# Patient Record
Sex: Male | Born: 1955 | Race: Black or African American | Hispanic: No | Smoking: Former smoker
Health system: Southern US, Community
[De-identification: ages and names within clinical notes are randomized; demographics above are authoritative.]

## PROBLEM LIST (undated history)

## (undated) DIAGNOSIS — G5620 Lesion of ulnar nerve, unspecified upper limb: Secondary | ICD-10-CM

## (undated) DIAGNOSIS — R0789 Other chest pain: Secondary | ICD-10-CM

## (undated) DIAGNOSIS — G43909 Migraine, unspecified, not intractable, without status migrainosus: Secondary | ICD-10-CM

## (undated) DIAGNOSIS — I219 Acute myocardial infarction, unspecified: Secondary | ICD-10-CM

## (undated) DIAGNOSIS — K759 Inflammatory liver disease, unspecified: Secondary | ICD-10-CM

## (undated) DIAGNOSIS — I2510 Atherosclerotic heart disease of native coronary artery without angina pectoris: Secondary | ICD-10-CM

## (undated) DIAGNOSIS — I10 Essential (primary) hypertension: Secondary | ICD-10-CM

## (undated) DIAGNOSIS — F329 Major depressive disorder, single episode, unspecified: Secondary | ICD-10-CM

## (undated) DIAGNOSIS — F419 Anxiety disorder, unspecified: Secondary | ICD-10-CM

## (undated) DIAGNOSIS — E785 Hyperlipidemia, unspecified: Secondary | ICD-10-CM

## (undated) DIAGNOSIS — I639 Cerebral infarction, unspecified: Secondary | ICD-10-CM

## (undated) DIAGNOSIS — M1991 Primary osteoarthritis, unspecified site: Secondary | ICD-10-CM

## (undated) DIAGNOSIS — R011 Cardiac murmur, unspecified: Secondary | ICD-10-CM

## (undated) DIAGNOSIS — G40309 Generalized idiopathic epilepsy and epileptic syndromes, not intractable, without status epilepticus: Secondary | ICD-10-CM

## (undated) DIAGNOSIS — M4802 Spinal stenosis, cervical region: Secondary | ICD-10-CM

## (undated) DIAGNOSIS — R569 Unspecified convulsions: Secondary | ICD-10-CM

## (undated) DIAGNOSIS — D649 Anemia, unspecified: Secondary | ICD-10-CM

## (undated) HISTORY — PX: HERNIA REPAIR: SHX51

## (undated) HISTORY — DX: Primary osteoarthritis, unspecified site: M19.91

## (undated) HISTORY — DX: Cerebral infarction, unspecified: I63.9

## (undated) HISTORY — PX: COLON SURGERY: SHX602

## (undated) HISTORY — DX: Other chest pain: R07.89

## (undated) HISTORY — DX: Migraine, unspecified, not intractable, without status migrainosus: G43.909

## (undated) HISTORY — DX: Anemia, unspecified: D64.9

## (undated) HISTORY — DX: Hyperlipidemia, unspecified: E78.5

## (undated) HISTORY — PX: ABDOMINAL EXPLORATION SURGERY: SHX538

## (undated) HISTORY — DX: Generalized idiopathic epilepsy and epileptic syndromes, not intractable, without status epilepticus: G40.309

## (undated) HISTORY — PX: BACK SURGERY: SHX140

## (undated) HISTORY — DX: Atherosclerotic heart disease of native coronary artery without angina pectoris: I25.10

## (undated) HISTORY — DX: Lesion of ulnar nerve, unspecified upper limb: G56.20

## (undated) HISTORY — DX: Major depressive disorder, single episode, unspecified: F32.9

## (undated) HISTORY — DX: Essential (primary) hypertension: I10

## (undated) HISTORY — DX: Spinal stenosis, cervical region: M48.02

## (undated) HISTORY — PX: SOFT TISSUE TUMOR RESECTION: SHX1054

## (undated) NOTE — *Deleted (*Deleted)
10/19/2020 11:21 AM   Abagail Kitchens Jun 04, 1956 [id number]  Referring provider: Preston Fleeting, MD [address] [place],  [place] [address]  No chief complaint on file.   HPI: 74 year old male with a personal history of bladder neck obstruction, prostate nodule and BPH with LUTS returns today for routine annual follow-up.  History of bladder neck obstruction He is s/p TULIP 10/2016 for bladder neck obstruction.  Cysto 11/2019 with Dr. Apolinar Junes revealed a widely patent bladder neck, no evidence of bladder neck contracture or recurrence of his stricture.  PVR today is *** mL.    Prostate nodule Noted to have a nodule the left base of the prostate in 2019.  He underwent prostate biopsy 08/2018 due to the presence of the nodule and a strong family history of advanced metastatic prostate cancer.  Prostate biopsy was benign.  TRUS vol 42 g.  PSA 1.4 on 09/14/2019.   Component     Latest Ref Rng & Units 04/18/2016 10/18/2016 07/12/2017 07/09/2018  Prostate Specific Ag, Serum     0.0 - 4.0 ng/mL 1.2 0.7 1.2 1.6   Component     Latest Ref Rng & Units 09/14/2019 10/13/2020  Prostate Specific Ag, Serum     0.0 - 4.0 ng/mL 1.4 1.7    BPH WITH LUTS  (prostate and/or bladder) I PSS score: ***      PVR: ***     Previous IPSS score: 11/2   Previous PVR: 40 mL     Major complaint(s):  Nocturia and hesitancy x for over three years. Denies any dysuria, hematuria or suprapubic pain.   Currently taking: oxybutynin XL 15 mg daily   Denies any recent fevers, chills, nausea or vomiting.     Score:  1-7 Mild 8-19 Moderate 20-35 Severe  PMH: Past Medical History:  Diagnosis Date  . 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 Anxiety   . Atypical chest pain 04/17/2016  . CAD in native artery 04/17/2016  . Cerebral infarction (HCC) 11/25/2013   Overview:  IMO Problem List Replacer Jan. 2016   . Cervical spinal stenosis 07/06/2015  . Essential (primary) hypertension 04/15/2008  . 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 Headache, migraine 11/25/2013  . Heart murmur   . Hepatitis    Hep "C"  . HLD (hyperlipidemia) 03/27/2012  . Hyperlipidemia   . Idiopathic localized osteoarthropathy 02/26/2011  . Major depressive disorder with single episode 11/14/2009  . Myocardial infarction (HCC) 2009  . 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 Seizures (HCC)    Last Seizure 2013  . Stroke [facility])     Surgical History: Past Surgical History:  Procedure Laterality Date  . ABDOMINAL EXPLORATION SURGERY  2010?  [facility] Kitchen BACK SURGERY    . CERVICAL DISC ARTHROPLASTY N/A 09/11/2017   Procedure: CERVICAL ANTERIOR DISC ARTHROPLASTY C3-4 POSSIBLE C4-5;  Surgeon: Venetia Night, MD;  Location: [facility] ORS;  Service: Neurosurgery;  Laterality: N/A;  . COLON SURGERY     Colon Resection  . HERNIA REPAIR    . HOLMIUM LASER APPLICATION N/A 11/20/2016   Procedure: HOLMIUM LASER APPLICATION;  Surgeon: Vanna Scotland, MD;  Location: [facility] ORS;  Service: Urology;  Laterality: N/A;  . PROSTATE ABLATION N/A 11/20/2016   Procedure: PROSTATE ABLATION;  Surgeon: Vanna Scotland, MD;  Location: [facility] ORS;  Service: Urology;  Laterality: N/A;  . SOFT TISSUE TUMOR RESECTION     left thigh  . TRANSURETHRAL RESECTION OF PROSTATE N/A 11/20/2016   Procedure: TRANSURETHRAL RESECTION OF THE PROSTATE (TURP);  Surgeon: Vanna Scotland, MD;  Location: [facility] ORS;  Service: Urology;  Laterality: N/A;    Home Medications:  Allergies as of 10/19/2020      Reactions   Gabapentin Other (See Comments)   Lisinopril    Sulfa Antibiotics Rash      Medication List       Accurate as of October 18, 2020 11:21 AM. If you have any questions, ask your nurse or doctor.        aspirin EC 81 MG tablet Take 1 tablet (81 mg total) by mouth daily.   cholecalciferol 1000 units tablet Commonly known as: VITAMIN D Take 1,000 Units by mouth daily.   Vitamin D-3 125 MCG (5000 UT) Tabs Take 5,000 Units by mouth daily.    citalopram 40 MG tablet Commonly known as: CELEXA TAKE 1 TABLET DAILY BY MOUTH   hydrochlorothiazide 25 MG tablet Commonly known as: HYDRODIURIL Take 25 mg by mouth daily.   oxybutynin 15 MG 24 hr tablet Commonly known as: DITROPAN XL Take 1 tablet (15 mg total) by mouth daily.   phenytoin 100 MG ER capsule Commonly known as: DILANTIN TAKE 3 CAPSULES IN THE MORNING AND 2 CAPSULES EVERY EVENING   pregabalin 75 MG capsule Commonly known as: LYRICA Take by mouth.   simvastatin 20 MG tablet Commonly known as: ZOCOR TAKE 1 TABLET EVERY EVENING FOR HIGH CHOLESTEROL       Allergies:  Allergies  Allergen Reactions  . Gabapentin Other (See Comments)  . Lisinopril   . Sulfa Antibiotics Rash    Family History: Family History  Problem Relation Age of Onset  . Bladder Cancer Neg Hx   . Prostate cancer Neg Hx   . Kidney cancer Neg Hx     Social History:  reports that he quit smoking about 13 years ago. His smoking use included cigarettes. He has never used smokeless tobacco. He reports current drug use. Drug: Marijuana. He reports that he does not drink alcohol.  ROS: For pertinent review of systems please refer to history of present illness  Physical Exam: There were no vitals taken for this visit.  Constitutional:  Well nourished. Alert and oriented, No acute distress. HEENT: [place] AT, moist mucus membranes.  Trachea midline Cardiovascular: No clubbing, cyanosis, or edema. Respiratory: Normal respiratory effort, no increased work of breathing. GI: Abdomen is soft, non tender, non distended, no abdominal masses. Liver and spleen not palpable.  No hernias appreciated.  Stool sample for occult testing is not indicated.   GU: No CVA tenderness.  No bladder fullness or masses.  Patient with circumcised/uncircumcised phallus. ***Foreskin easily retracted***  Urethral meatus is patent.  No penile discharge. No penile lesions or rashes. Scrotum without lesions, cysts, rashes and/or  edema.  Testicles are located scrotally bilaterally. No masses are appreciated in the testicles. Left and right epididymis are normal. Rectal: Patient with  normal sphincter tone. Anus and perineum without scarring or rashes. No rectal masses are appreciated. Prostate is approximately *** grams, *** nodules are appreciated. Seminal vesicles are normal. Skin: No rashes, bruises or suspicious lesions. Lymph: No inguinal adenopathy. Neurologic: Grossly intact, no focal deficits, moving all 4 extremities. Psychiatric: Normal mood and affect.  Laboratory Data: Urinalysis *** Lab Results  Component Value Date   WBC 5.7 08/30/2017   HGB 13.4 08/30/2017   HCT 39.7 (L) 08/30/2017   MCV 90.8 08/30/2017   PLT 235 08/30/2017    Lab Results  Component Value Date   CREATININE 0.88 08/30/2017  PSA as above I have reviewed the labs.  Pertinent imaging ***   Assessment & Plan:    1.  BPH with LUTS IPSS score is 11/2 Continue conservative management, avoiding bladder irritants and timed voiding's At goal with oxybutynin Continue oxybutynin XL 15 mg daily -changed script to 90 days with refills RTC in September 2021 for PSA, I PSS, PVR and exam  2. Family history of prostate cancer Status post previous biopsy which was negative RTC in September 2021 for PSA and exam  3. Prostate nodule As above, likely BPH nodule   No follow-ups on file.  Michiel Cowboy, PA-C  [facility] [address] [place], [place] [address] [phone]

---

## 2004-10-12 ENCOUNTER — Emergency Department: Payer: Self-pay | Admitting: Emergency Medicine

## 2005-10-23 ENCOUNTER — Emergency Department: Payer: Self-pay | Admitting: Unknown Physician Specialty

## 2005-10-23 ENCOUNTER — Other Ambulatory Visit: Payer: Self-pay

## 2006-01-25 ENCOUNTER — Emergency Department: Payer: Self-pay | Admitting: Emergency Medicine

## 2006-04-15 ENCOUNTER — Ambulatory Visit: Payer: Self-pay | Admitting: General Practice

## 2008-01-01 DIAGNOSIS — I219 Acute myocardial infarction, unspecified: Secondary | ICD-10-CM

## 2008-01-01 HISTORY — DX: Acute myocardial infarction, unspecified: I21.9

## 2008-02-20 DIAGNOSIS — G40309 Generalized idiopathic epilepsy and epileptic syndromes, not intractable, without status epilepticus: Secondary | ICD-10-CM

## 2008-02-20 HISTORY — DX: Generalized idiopathic epilepsy and epileptic syndromes, not intractable, without status epilepticus: G40.309

## 2008-04-15 DIAGNOSIS — I10 Essential (primary) hypertension: Secondary | ICD-10-CM

## 2008-04-15 HISTORY — DX: Essential (primary) hypertension: I10

## 2008-07-09 ENCOUNTER — Other Ambulatory Visit: Payer: Self-pay

## 2008-07-09 ENCOUNTER — Observation Stay: Payer: Self-pay | Admitting: Internal Medicine

## 2008-08-13 ENCOUNTER — Other Ambulatory Visit: Payer: Self-pay

## 2008-08-13 ENCOUNTER — Inpatient Hospital Stay: Payer: Self-pay | Admitting: Internal Medicine

## 2009-11-14 DIAGNOSIS — F329 Major depressive disorder, single episode, unspecified: Secondary | ICD-10-CM

## 2009-11-14 HISTORY — DX: Major depressive disorder, single episode, unspecified: F32.9

## 2011-02-26 DIAGNOSIS — M1991 Primary osteoarthritis, unspecified site: Secondary | ICD-10-CM | POA: Insufficient documentation

## 2011-02-26 HISTORY — DX: Primary osteoarthritis, unspecified site: M19.91

## 2012-03-27 DIAGNOSIS — D649 Anemia, unspecified: Secondary | ICD-10-CM | POA: Insufficient documentation

## 2012-03-27 DIAGNOSIS — E785 Hyperlipidemia, unspecified: Secondary | ICD-10-CM

## 2012-03-27 HISTORY — DX: Anemia, unspecified: D64.9

## 2012-03-27 HISTORY — DX: Hyperlipidemia, unspecified: E78.5

## 2013-11-25 DIAGNOSIS — G43909 Migraine, unspecified, not intractable, without status migrainosus: Secondary | ICD-10-CM | POA: Insufficient documentation

## 2013-11-25 DIAGNOSIS — I639 Cerebral infarction, unspecified: Secondary | ICD-10-CM

## 2013-11-25 HISTORY — DX: Migraine, unspecified, not intractable, without status migrainosus: G43.909

## 2013-11-25 HISTORY — DX: Cerebral infarction, unspecified: I63.9

## 2014-04-08 ENCOUNTER — Emergency Department: Payer: Self-pay | Admitting: Emergency Medicine

## 2014-09-24 ENCOUNTER — Emergency Department: Payer: Self-pay | Admitting: Emergency Medicine

## 2014-09-24 LAB — COMPREHENSIVE METABOLIC PANEL
Albumin: 3.6 g/dL (ref 3.4–5.0)
Alkaline Phosphatase: 105 U/L
Anion Gap: 4 — ABNORMAL LOW (ref 7–16)
BILIRUBIN TOTAL: 0.3 mg/dL (ref 0.2–1.0)
BUN: 13 mg/dL (ref 7–18)
CREATININE: 1.41 mg/dL — AB (ref 0.60–1.30)
Calcium, Total: 8.7 mg/dL (ref 8.5–10.1)
Chloride: 104 mmol/L (ref 98–107)
Co2: 29 mmol/L (ref 21–32)
EGFR (African American): >60
EGFR (Non-African Amer.): 55 — ABNORMAL LOW
GLUCOSE: 111 mg/dL — AB (ref 65–99)
Osmolality: 275 (ref 275–301)
POTASSIUM: 3.5 mmol/L (ref 3.5–5.1)
SGOT(AST): 77 U/L — ABNORMAL HIGH (ref 15–37)
SGPT (ALT): 58 U/L
Sodium: 137 mmol/L (ref 136–145)
TOTAL PROTEIN: 7.9 g/dL (ref 6.4–8.2)

## 2014-09-24 LAB — URINALYSIS, COMPLETE
BILIRUBIN, UR: NEGATIVE
Bacteria: NONE SEEN
Glucose,UR: NEGATIVE mg/dL (ref 0–75)
Hyaline Cast: 32
Ketone: NEGATIVE
Leukocyte Esterase: NEGATIVE
NITRITE: NEGATIVE
PH: 5 (ref 4.5–8.0)
Protein: 100
SPECIFIC GRAVITY: 1.02 (ref 1.003–1.030)
Squamous Epithelial: 1
WBC UR: 5 /HPF (ref 0–5)

## 2014-09-24 LAB — CBC WITH DIFFERENTIAL/PLATELET
BASOS ABS: 0 10*3/uL (ref 0.0–0.1)
Basophil %: 0.4 %
EOS ABS: 0 10*3/uL (ref 0.0–0.7)
EOS PCT: 0.1 %
HCT: 43.3 % (ref 40.0–52.0)
HGB: 14.9 g/dL (ref 13.0–18.0)
Lymphocyte #: 0.4 10*3/uL — ABNORMAL LOW (ref 1.0–3.6)
Lymphocyte %: 7.7 %
MCH: 31.2 pg (ref 26.0–34.0)
MCHC: 34.3 g/dL (ref 32.0–36.0)
MCV: 91 fL (ref 80–100)
MONO ABS: 0.3 x10 3/mm (ref 0.2–1.0)
MONOS PCT: 6.7 %
NEUTROS PCT: 85.1 %
Neutrophil #: 4.3 10*3/uL (ref 1.4–6.5)
PLATELETS: 148 10*3/uL — AB (ref 150–440)
RBC: 4.77 10*6/uL (ref 4.40–5.90)
RDW: 13.9 % (ref 11.5–14.5)
WBC: 5 10*3/uL (ref 3.8–10.6)

## 2014-09-24 LAB — LIPASE, BLOOD: LIPASE: 110 U/L (ref 73–393)

## 2014-09-24 LAB — PHENYTOIN LEVEL, TOTAL: Dilantin: 3.9 ug/mL — ABNORMAL LOW (ref 10.0–20.0)

## 2014-09-29 LAB — CULTURE, BLOOD (SINGLE)

## 2014-10-06 DIAGNOSIS — G4485 Primary stabbing headache: Secondary | ICD-10-CM | POA: Insufficient documentation

## 2015-01-19 DIAGNOSIS — G40309 Generalized idiopathic epilepsy and epileptic syndromes, not intractable, without status epilepticus: Secondary | ICD-10-CM | POA: Insufficient documentation

## 2015-07-06 DIAGNOSIS — M4802 Spinal stenosis, cervical region: Secondary | ICD-10-CM

## 2015-07-06 HISTORY — DX: Spinal stenosis, cervical region: M48.02

## 2015-09-29 DIAGNOSIS — G5620 Lesion of ulnar nerve, unspecified upper limb: Secondary | ICD-10-CM

## 2015-09-29 HISTORY — DX: Lesion of ulnar nerve, unspecified upper limb: G56.20

## 2016-04-17 ENCOUNTER — Other Ambulatory Visit: Payer: Self-pay | Admitting: Cardiology

## 2016-04-17 DIAGNOSIS — Z01810 Encounter for preprocedural cardiovascular examination: Secondary | ICD-10-CM

## 2016-04-17 DIAGNOSIS — R0789 Other chest pain: Secondary | ICD-10-CM

## 2016-04-17 DIAGNOSIS — I2510 Atherosclerotic heart disease of native coronary artery without angina pectoris: Secondary | ICD-10-CM

## 2016-04-17 HISTORY — DX: Other chest pain: R07.89

## 2016-04-17 HISTORY — DX: Atherosclerotic heart disease of native coronary artery without angina pectoris: I25.10

## 2016-04-18 ENCOUNTER — Encounter: Payer: Self-pay | Admitting: Urology

## 2016-04-18 ENCOUNTER — Ambulatory Visit (INDEPENDENT_AMBULATORY_CARE_PROVIDER_SITE_OTHER): Payer: Medicaid Other | Admitting: Urology

## 2016-04-18 VITALS — BP 125/72 | HR 60 | Ht 70.0 in | Wt 146.0 lb

## 2016-04-18 DIAGNOSIS — Z125 Encounter for screening for malignant neoplasm of prostate: Secondary | ICD-10-CM

## 2016-04-18 DIAGNOSIS — N138 Other obstructive and reflux uropathy: Secondary | ICD-10-CM

## 2016-04-18 DIAGNOSIS — N419 Inflammatory disease of prostate, unspecified: Secondary | ICD-10-CM

## 2016-04-18 DIAGNOSIS — N401 Enlarged prostate with lower urinary tract symptoms: Secondary | ICD-10-CM | POA: Diagnosis not present

## 2016-04-18 LAB — BLADDER SCAN AMB NON-IMAGING

## 2016-04-18 MED ORDER — FINASTERIDE 5 MG PO TABS: 5.0000 mg | ORAL_TABLET | Freq: Every day | ORAL | Status: DC

## 2016-04-18 NOTE — Progress Notes (Signed)
04/18/2016 8:12 PM   Kenneth Daniels 02/01/1956 [id number]  Referring provider: Rochel Brome  Chief Complaint  Patient presents with  . Prostatitis    New Patient    HPI:  60 year old male who scheduled to undergo bilateral inguinal hernia repair with Dr. Tamala Julian in the near future referred for workup of voiding symptoms.  He does report significant urinary symptoms including straining when urinating, sensation of incomplete bladder emptying, and urinary frequency along with nocturia 3. He is overall dissatisfied with his voiding symptoms.  This has been going on for at least a year.    He is currently on Flomax and has been for several years.  No recent PSA.  + cousin died of prostate cancer, age 69.    PVR today 44 cc.  PCP Dr. Reather Laurence at [facility] center.       IPSS      04/18/16 1400       International Prostate Symptom Score   How often have you had the sensation of not emptying your bladder? Almost always     How often have you had to urinate less than every two hours? Almost always     How often have you found you stopped and started again several times when you urinated? About half the time     How often have you found it difficult to postpone urination? Less than half the time     How often have you had a weak urinary stream? About half the time     How often have you had to strain to start urination? More than half the time     How many times did you typically get up at night to urinate? 3 Times     Total IPSS Score 25     Quality of Life due to urinary symptoms   If you were to spend the rest of your life with your urinary condition just the way it is now how would you feel about that? Mostly Disatisfied        Score:  1-7 Mild 8-19 Moderate 20-35 Severe   PMH: Past Medical History  Diagnosis Date  . CAD in native artery 04/17/2016  . Essential (primary) hypertension 04/15/2008  . Headache, migraine 11/25/2013  . Cerebral  infarction ([place]) 11/25/2013    Overview:  IMO Problem List Replacer Jan. 2016   . Generalized convulsive epilepsy ([place]) 02/20/2008  . 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 Idiopathic localized osteoarthropathy 02/26/2011  . 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 Atypical chest pain 04/17/2016  . Cervical spinal stenosis 07/06/2015  . HLD (hyperlipidemia) 03/27/2012  . Major depressive disorder with single episode ([place]) 11/14/2009    Surgical History: Past Surgical History  Procedure Laterality Date  . Back surgery    . Soft tissue tumor resection      left thigh  . Abdominal exploration surgery  2010?    Home Medications:    Medication List       This list is accurate as of: 04/18/16 11:59 PM.  Always use your most recent med list.               citalopram 40 MG tablet  Commonly known as:  CELEXA  TK 1 T PO QD     finasteride 5 MG tablet  Commonly known as:  PROSCAR  Take 1 tablet (5 mg total) by mouth daily.     lisinopril 10 MG tablet  Commonly known as:  PRINIVIL,ZESTRIL  TK  1 T PO D FOR HIGH BP     phenytoin 100 MG ER capsule  Commonly known as:  DILANTIN  TK 3 CS PO QAM AND 2 CS PO QPM     simvastatin 20 MG tablet  Commonly known as:  ZOCOR  TK 1 T PO QPM FOR HIGH CHOLESTEROL     tamsulosin 0.4 MG Caps capsule  Commonly known as:  FLOMAX  TK 2 CS PO QD 30 MIN AFTER THE SAME MEAL B BED        Allergies:  Allergies  Allergen Reactions  . Sulfa Antibiotics     Other reaction(s): RASH    Family History: Family History  Problem Relation Age of Onset  . Bladder Cancer Neg Hx   . Prostate cancer Neg Hx   . Kidney cancer Neg Hx     Social History:  reports that he has quit smoking. He does not have any smokeless tobacco history on file. He reports that he uses illicit drugs (Marijuana). He reports that he does not drink alcohol.  ROS: UROLOGY Frequent Urination?: Yes Hard to postpone urination?: Yes Burning/pain with urination?: No Get up at night to  urinate?: Yes Leakage of urine?: Yes Urine stream starts and stops?: Yes Trouble starting stream?: No Do you have to strain to urinate?: Yes Blood in urine?: No Urinary tract infection?: No Sexually transmitted disease?: No Injury to kidneys or bladder?: No Painful intercourse?: No Weak stream?: No Erection problems?: Yes Penile pain?: No  Gastrointestinal Nausea?: No Vomiting?: No Indigestion/heartburn?: No Diarrhea?: No Constipation?: No  Constitutional Fever: No Night sweats?: Yes Weight loss?: Yes Fatigue?: Yes  Skin Skin rash/lesions?: Yes Itching?: No  Eyes Blurred vision?: No Double vision?: No  Ears/Nose/Throat Sore throat?: Yes Sinus problems?: No  Hematologic/Lymphatic Swollen glands?: Yes Easy bruising?: No  Cardiovascular Leg swelling?: No Chest pain?: No  Respiratory Cough?: Yes Shortness of breath?: Yes  Endocrine Excessive thirst?: No  Musculoskeletal Back pain?: Yes Joint pain?: No  Neurological Headaches?: Yes Dizziness?: Yes  Psychologic Depression?: Yes Anxiety?: Yes  Physical Exam: BP 125/72 mmHg  Pulse 60  Ht 5' 10" (1.778 m)  Wt 146 lb (66.225 kg)  BMI 20.95 kg/m2  Constitutional:  Alert and oriented, No acute distress. HEENT: [place] AT, moist mucus membranes.  Trachea midline, no masses. Cardiovascular: No clubbing, cyanosis, or edema. Respiratory: Normal respiratory effort, no increased work of breathing. GI: Abdomen is soft, nontender, nondistended, no abdominal masses GU: No CVA tenderness.  Uncircumcised phallus, easily retractable foreskin.  Normal descended testicles with no masses. Rectal exam: normal sphincter tone.  40-50 cc prostate, nontender, no masses.   Skin: No rashes, bruises or suspicious lesions. Lymph: No cervical or inguinal adenopathy. Neurologic: Grossly intact, no focal deficits, moving all 4 extremities. Psychiatric: Normal mood and affect.  Laboratory Data: Comprehensive Metabolic Panel  (CMP) (68/11/7516 12:15 PM) Comprehensive Metabolic Panel (CMP) (00/17/4944 12:15 PM)  Component Value Ref Range  Glucose 75 70 - 110 mg/dL  Sodium 137 136 - 145 mmol/L  Potassium 5.1 3.6 - 5.1 mmol/L  Chloride 102 97 - 109 mmol/L  Carbon Dioxide (CO2) 31.1 22.0 - 32.0 mmol/L  Urea Nitrogen (BUN) 14 7 - 25 mg/dL  Creatinine 1.0 0.7 - 1.3 mg/dL  Glomerular Filtration Rate (eGFR), MDRD Estimate 92 >60 mL/min/1.73sq m   CBC w/auto Differential (5 Part) (04/11/2016 12:15 PM) CBC w/auto Differential (5 Part) (04/11/2016 12:15 PM)  Component Value Ref Range  WBC (White Blood Cell Count) 5.8 4.1 - 10.2  10^3/uL  RBC (Red Blood Cell Count) 4.05 (L) 4.69 - 6.13 10^6/uL  Hemoglobin 12.5 (L) 14.1 - 18.1 gm/dL  Hematocrit 37.5 (L) 40.0 - 52.0 %  MCV (Mean Corpuscular Volume) 92.6 80.0 - 100.0 fl  MCH (Mean Corpuscular Hemoglobin) 30.9 27.0 - 31.2 pg  MCHC (Mean Corpuscular Hemoglobin Concentration) 33.3 32.0 - 36.0 gm/dL  Platelet Count 238 150 - 450 10^3/uL      Urinalysis UA negative   Pertinent Imaging: n/a  Assessment & Plan:    1. BPH (benign prostatic hypertrophy) with urinary obstruction Primarily obstructing voiding symptoms, chronic No evidence of incomplete bladder emtpying, PVR minimal Continue flomax Start finasteride, discussed side effects and time to maximal efficacy May require outlet procedure down the road if fails to resolve/ improve - Urinalysis, Complete - BLADDER SCAN AMB NON-IMAGING  2. Screening PSA (prostate specific antigen) PSA checked today after discussion of prostate cancer screening DRE enlarged but non concerning - PSA -will call with PSA results   Return in about 6 months (around 10/18/2016) for IPSS.  Hollice Espy, MD  [facility] [address], [place], [place] [address] [phone]  No urological contraindications for hernia repair.  Slightly higher risk for post op retention but medically  optimized.

## 2016-04-19 LAB — URINALYSIS, COMPLETE
BILIRUBIN UA: NEGATIVE
Glucose, UA: NEGATIVE
KETONES UA: NEGATIVE
LEUKOCYTES UA: NEGATIVE
Nitrite, UA: NEGATIVE
Protein, UA: NEGATIVE
RBC UA: NEGATIVE
SPEC GRAV UA: 1.01 (ref 1.005–1.030)
UUROB: 0.2 mg/dL (ref 0.2–1.0)
pH, UA: 5.5 (ref 5.0–7.5)

## 2016-04-19 LAB — MICROSCOPIC EXAMINATION: Bacteria, UA: NONE SEEN

## 2016-04-19 LAB — PSA: PROSTATE SPECIFIC AG, SERUM: 1.2 ng/mL (ref 0.0–4.0)

## 2016-04-20 ENCOUNTER — Encounter
Admission: RE | Admit: 2016-04-20 | Discharge: 2016-04-20 | Disposition: A | Discharge status: Home / Self Care | Payer: Medicaid Other | Source: Ambulatory Visit | Attending: Cardiology | Admitting: Cardiology

## 2016-04-20 DIAGNOSIS — Z01810 Encounter for preprocedural cardiovascular examination: Secondary | ICD-10-CM | POA: Diagnosis not present

## 2016-04-20 DIAGNOSIS — R0789 Other chest pain: Secondary | ICD-10-CM | POA: Diagnosis present

## 2016-04-20 LAB — NM MYOCAR MULTI W/SPECT W/WALL MOTION / EF
CHL CUP MPHR: 160 {beats}/min
CHL CUP NUCLEAR SDS: 1
CHL CUP NUCLEAR SRS: 6
CHL CUP NUCLEAR SSS: 0
CSEPEW: 13.4 METS
CSEPHR: 79 %
Exercise duration (min): 12 min
LV sys vol: 42 mL
LVDIAVOL: 110 mL (ref 62–150)
Peak HR: 127 {beats}/min
Rest HR: 48 {beats}/min
TID: 0.93

## 2016-04-20 MED ORDER — TECHNETIUM TC 99M SESTAMIBI - CARDIOLITE
12.6800 | Freq: Once | INTRAVENOUS | Status: AC | PRN
  Administered 2016-04-20: 12.68 via INTRAVENOUS

## 2016-04-20 MED ORDER — TECHNETIUM TC 99M SESTAMIBI - CARDIOLITE
32.5400 | Freq: Once | INTRAVENOUS | Status: AC | PRN
  Administered 2016-04-20: 32.54 via INTRAVENOUS

## 2016-04-24 ENCOUNTER — Telehealth: Payer: Self-pay

## 2016-04-24 ENCOUNTER — Encounter: Payer: Self-pay | Admitting: Urology

## 2016-04-24 ENCOUNTER — Telehealth: Payer: Self-pay | Admitting: Urology

## 2016-04-24 NOTE — Telephone Encounter (Signed)
-----   Message from Hollice Espy, MD sent at 04/19/2016  4:59 PM EDT ----- Please let Mr. Brester know that his PSA was within normal limits.  Hollice Espy, MD

## 2016-04-24 NOTE — Telephone Encounter (Signed)
Patient was seen in our clinic on 04/18/2016 for urinary issues prior to Dr. Tamala Julian scheduling the patient for hernia repair.  Dr. Tamala Julian said that if you would just provide documentation in the office visit that his urinary symptoms would not prohibit him from having hernia surgery.  Judeen Hammans, Dr Thompson Caul nurse, can be reached at phone # [phone] and fax # [phone].

## 2016-04-24 NOTE — Telephone Encounter (Signed)
Patient notified/SW 

## 2016-04-24 NOTE — Telephone Encounter (Signed)
Please fax note.    Hollice Espy, MD

## 2016-04-25 NOTE — Telephone Encounter (Signed)
Done ° ° °Michelle °

## 2016-06-06 DIAGNOSIS — E559 Vitamin D deficiency, unspecified: Secondary | ICD-10-CM | POA: Insufficient documentation

## 2016-10-18 ENCOUNTER — Encounter: Payer: Self-pay | Admitting: Urology

## 2016-10-18 ENCOUNTER — Ambulatory Visit (INDEPENDENT_AMBULATORY_CARE_PROVIDER_SITE_OTHER): Payer: Medicaid Other | Admitting: Urology

## 2016-10-18 VITALS — BP 110/66 | HR 48 | Ht 70.0 in | Wt 147.4 lb

## 2016-10-18 DIAGNOSIS — N138 Other obstructive and reflux uropathy: Secondary | ICD-10-CM

## 2016-10-18 DIAGNOSIS — Z125 Encounter for screening for malignant neoplasm of prostate: Secondary | ICD-10-CM

## 2016-10-18 DIAGNOSIS — N401 Enlarged prostate with lower urinary tract symptoms: Secondary | ICD-10-CM | POA: Diagnosis not present

## 2016-10-18 NOTE — Patient Instructions (Addendum)
Cystoscopy  Cystoscopy is a procedure that is used to help your caregiver diagnose and sometimes treat conditions that affect your lower urinary tract. Your lower urinary tract includes your bladder and the tube through which urine passes from your bladder out of your body (urethra). Cystoscopy is performed with a thin, tube-shaped instrument (cystoscope). The cystoscope has lenses and a light at the end so that your caregiver can see inside your bladder. The cystoscope is inserted at the entrance of your urethra. Your caregiver guides it through your urethra and into your bladder. There are two main types of cystoscopy:  · Flexible cystoscopy (with a flexible cystoscope).  · Rigid cystoscopy (with a rigid cystoscope).  Cystoscopy may be recommended for many conditions, including:  · Urinary tract infections.  · Blood in your urine (hematuria).  · Loss of bladder control (urinary incontinence) or overactive bladder.  · Unusual cells found in a urine sample.  · Urinary blockage.  · Painful urination.  Cystoscopy may also be done to remove a sample of your tissue to be checked under a microscope (biopsy). It may also be done to remove or destroy bladder stones.  LET YOUR CAREGIVER KNOW ABOUT:  · Allergies to food or medicine.  · Medicines taken, including vitamins, herbs, eyedrops, over-the-counter medicines, and creams.  · Use of steroids (by mouth or creams).  · Previous problems with anesthetics or numbing medicines.  · History of bleeding problems or blood clots.  · Previous surgery.  · Other health problems, including diabetes and kidney problems.  · Possibility of pregnancy, if this applies.  PROCEDURE  The area around the opening to your urethra will be cleaned. A medicine to numb your urethra (local anesthetic) is used. If a tissue sample or stone is removed during the procedure, you may be given a medicine to make you sleep (general anesthetic).  Your caregiver will gently insert the tip of the cystoscope  into your urethra. The cystoscope will be slowly glided through your urethra and into your bladder. Sterile fluid will flow through the cystoscope and into your bladder. The fluid will expand and stretch your bladder. This gives your caregiver a better view of your bladder walls. The procedure lasts about 15-20 minutes.  AFTER THE PROCEDURE  If a local anesthetic is used, you will be allowed to go home as soon as you are ready. If a general anesthetic is used, you will be taken to a recovery area until you are stable. You may have temporary bleeding and burning on urination.     This information is not intended to replace advice given to you by your health care provider. Make sure you discuss any questions you have with your health care provider.     Document Released: 12/14/2000 Document Revised: 01/07/2015 Document Reviewed: 06/09/2012  Elsevier Interactive Patient Education ©2016 [employer]

## 2016-10-18 NOTE — Progress Notes (Signed)
bph

## 2016-10-18 NOTE — Progress Notes (Signed)
10/18/2016 10:30 AM   Kenneth Daniels 1956/08/09 [id number]  Referring provider: Rochel Brome  Chief Complaint  Patient presents with  . Benign Prostatic Hypertrophy    6 month follow up    HPI:  Patient is a 60 year old [place] American male who presents today for a 6 months follow up.    He does report significant urinary symptoms including straining when urinating, sensation of incomplete bladder emptying, and urinary frequency along with nocturia 3.  He is overall dissatisfied with his voiding symptoms.  This has been going on for at least a year.    He is currently on Flomax and has been for several years and finasteride 5 mg daily was started in 03/2016.    He is still experiencing post- void dribbling even after waiting at the urinal for 5 to 10 minutes.    Recent PSA was 1.2 ng/mL Apr 20, 20187.   + cousin died of prostate cancer, age 53.   PCP Dr. Reather Laurence at [facility] center.       IPSS    Row Name 10/18/16 1000         International Prostate Symptom Score   How often have you had the sensation of not emptying your bladder? More than half the time     How often have you had to urinate less than every two hours? Less than half the time     How often have you found you stopped and started again several times when you urinated? Less than half the time     How often have you found it difficult to postpone urination? More than half the time     How often have you had a weak urinary stream? Almost always     How often have you had to strain to start urination? About half the time     How many times did you typically get up at night to urinate? 4 Times     Total IPSS Score 24       Quality of Life due to urinary symptoms   If you were to spend the rest of your life with your urinary condition just the way it is now how would you feel about that? Mostly Satisfied        Score:  1-7 Mild 8-19 Moderate 20-35 Severe   PMH: Past Medical  History:  Diagnosis Date  . 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 Atypical chest pain 04/17/2016  . CAD in native artery 04/17/2016  . Cerebral infarction ([place]) 11/25/2013   Overview:  IMO Problem List Replacer Jan. 2016   . Cervical spinal stenosis 07/06/2015  . Essential (primary) hypertension 04/15/2008  . 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 Headache, migraine 11/25/2013  . HLD (hyperlipidemia) 03/27/2012  . Idiopathic localized osteoarthropathy 02/26/2011  . Major depressive disorder with single episode 11/14/2009  . 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 ABDOMINAL EXPLORATION SURGERY  2010?  [facility] Kitchen BACK SURGERY    . SOFT TISSUE TUMOR RESECTION     left thigh    Home Medications:    Medication List       Accurate as of 10/18/16 10:30 AM. Always use your most recent med list.          citalopram 40 MG tablet Commonly known as:  CELEXA TK 1 T PO QD   eucerin cream   finasteride 5 MG tablet Commonly known as:  PROSCAR Take 1 tablet (5 mg total) by mouth daily.   lisinopril 10 MG tablet Commonly known as:  PRINIVIL,ZESTRIL TK 1 T PO D FOR HIGH BP   omeprazole 20 MG capsule Commonly known as:  PRILOSEC   phenytoin 100 MG ER capsule Commonly known as:  DILANTIN TK 3 CS PO QAM AND 2 CS PO QPM   RA VITAMIN B-12 TR 1000 MCG Tbcr Generic drug:  Cyanocobalamin Take by mouth.   simvastatin 20 MG tablet Commonly known as:  ZOCOR TK 1 T PO QPM FOR HIGH CHOLESTEROL   tamsulosin 0.4 MG Caps capsule Commonly known as:  FLOMAX TK 2 CS PO QD 30 MIN AFTER THE SAME MEAL B BED   triamcinolone 0.025 % cream Commonly known as:  KENALOG   Vitamin D3 2000 units capsule Take by mouth.       Allergies:  Allergies  Allergen Reactions  . Sulfa Antibiotics     Other reaction(s): RASH    Family History: Family History  Problem Relation Age of Onset  . Bladder Cancer Neg Hx   . Prostate cancer  Neg Hx   . Kidney cancer Neg Hx     Social History:  reports that he has quit smoking. He has never used smokeless tobacco. He reports that he uses drugs, including Marijuana. He reports that he does not drink alcohol.  ROS: UROLOGY Frequent Urination?: Yes Hard to postpone urination?: Yes Burning/pain with urination?: No Get up at night to urinate?: Yes Leakage of urine?: Yes Urine stream starts and stops?: Yes Trouble starting stream?: Yes Do you have to strain to urinate?: No Blood in urine?: No Urinary tract infection?: No Sexually transmitted disease?: No Injury to kidneys or bladder?: No Painful intercourse?: No Weak stream?: No Erection problems?: No Penile pain?: No  Gastrointestinal Nausea?: No Vomiting?: No Indigestion/heartburn?: No Diarrhea?: No Constipation?: No  Constitutional Fever: No Night sweats?: No Weight loss?: Yes Fatigue?: No  Skin Skin rash/lesions?: No Itching?: No  Eyes Blurred vision?: No Double vision?: No  Ears/Nose/Throat Sore throat?: No Sinus problems?: No  Hematologic/Lymphatic Swollen glands?: No Easy bruising?: No  Cardiovascular Leg swelling?: No Chest pain?: No  Respiratory Cough?: No Shortness of breath?: No  Endocrine Excessive thirst?: No  Musculoskeletal Back pain?: Yes Joint pain?: No  Neurological Headaches?: Yes Dizziness?: No  Psychologic Depression?: Yes Anxiety?: No  Physical Exam: BP 110/66 (BP Location: Left Arm, Patient Position: Sitting, Cuff Size: Normal)   Pulse (!) 48   Ht _0  (1.778 m)   Wt 147 lb 6.4 oz (66.9 kg)   BMI 21.15 kg/m   Constitutional:  Alert and oriented, No acute distress. HEENT: [place] AT, moist mucus membranes.  Trachea midline, no masses. Cardiovascular: No clubbing, cyanosis, or edema. Respiratory: Normal respiratory effort, no increased work of breathing. GI: Abdomen is soft, nontender, nondistended, no abdominal masses GU: No CVA tenderness.   Uncircumcised phallus, easily retractable foreskin.  Normal descended testicles with no masses. Rectal exam: normal sphincter tone.  40-50 cc prostate, nontender, no masses.   Skin: No rashes, bruises or suspicious lesions. Lymph: No cervical or inguinal adenopathy. Neurologic: Grossly intact, no focal deficits, moving all 4 extremities. Psychiatric: Normal mood and affect.  Laboratory Data: Comprehensive Metabolic Panel (CMP) (65/78/4696 12:15 PM) Comprehensive Metabolic Panel (CMP) (29/52/8413 12:15 PM)  Component Value Ref Range  Glucose 75 70 - 110 mg/dL  Sodium 137 136 - 145 mmol/L  Potassium 5.1 3.6 - 5.1 mmol/L  Chloride 102 97 - 109  mmol/L  Carbon Dioxide (CO2) 31.1 22.0 - 32.0 mmol/L  Urea Nitrogen (BUN) 14 7 - 25 mg/dL  Creatinine 1.0 0.7 - 1.3 mg/dL  Glomerular Filtration Rate (eGFR), MDRD Estimate 92 >60 mL/min/1.73sq m   CBC w/auto Differential (5 Part) (04/11/2016 12:15 PM) CBC w/auto Differential (5 Part) (04/11/2016 12:15 PM)  Component Value Ref Range  WBC (White Blood Cell Count) 5.8 4.1 - 10.2 10^3/uL  RBC (Red Blood Cell Count) 4.05 (L) 4.69 - 6.13 10^6/uL  Hemoglobin 12.5 (L) 14.1 - 18.1 gm/dL  Hematocrit 37.5 (L) 40.0 - 52.0 %  MCV (Mean Corpuscular Volume) 92.6 80.0 - 100.0 fl  MCH (Mean Corpuscular Hemoglobin) 30.9 27.0 - 31.2 pg  MCHC (Mean Corpuscular Hemoglobin Concentration) 33.3 32.0 - 36.0 gm/dL  Platelet Count 238 150 - 450 10^3/uL   PSA was 1.2 ng/mL on 04/18/2016   Assessment & Plan:    1. BPH (benign prostatic hypertrophy) with urinary obstruction  - Primarily obstructing voiding symptoms, chronic  - No evidence of incomplete bladder emptying, PVR minimal  - Continue flomax and finasteride   - Desires an outlet procedure down the road if he is a candidate  - schedule cystoscopy- I have explained to the patient that they will  be scheduled for a cystoscopy in our office to evaluate their bladder.  The cystoscopy consists of passing a tube  with a lens up through their urethra and into their urinary bladder.   We will inject the urethra with a lidocaine gel prior to introducing the cystoscope to help with any discomfort during the procedure.   After the procedure, they might experience blood in the urine and discomfort with urination.  This will abate after the first few voids.  I have  encouraged the patient to increase water intake  during this time.  Patient denies any allergies to lidocaine.   - Urinalysis, Complete  - BLADDER SCAN AMB NON-IMAGING  2. Screening PSA (prostate specific antigen)  - PSA 1.2 ng/mL on 04/18/2016  - DRE enlarged but non concerning  - recommended annual screening   Return for cystoscopy for BOO.  Zara Council, [facility] [address] [place], [place] [address] [phone]

## 2016-10-19 ENCOUNTER — Telehealth: Payer: Self-pay

## 2016-10-19 LAB — PSA: Prostate Specific Ag, Serum: 0.7 ng/mL (ref 0.0–4.0)

## 2016-10-19 NOTE — Telephone Encounter (Signed)
LMOM- most recent labs are stable.  

## 2016-10-19 NOTE — Telephone Encounter (Signed)
-----   Message from Nori Riis, PA-C sent at 10/19/2016  8:11 AM EDT ----- Please notify the patient that his PSA is good.

## 2016-11-06 ENCOUNTER — Other Ambulatory Visit: Payer: Self-pay | Admitting: Radiology

## 2016-11-06 ENCOUNTER — Encounter: Payer: Self-pay | Admitting: Urology

## 2016-11-06 ENCOUNTER — Ambulatory Visit (INDEPENDENT_AMBULATORY_CARE_PROVIDER_SITE_OTHER): Payer: Medicaid Other | Admitting: Urology

## 2016-11-06 VITALS — BP 139/80 | HR 52 | Ht 70.0 in | Wt 146.1 lb

## 2016-11-06 DIAGNOSIS — N138 Other obstructive and reflux uropathy: Secondary | ICD-10-CM | POA: Diagnosis not present

## 2016-11-06 DIAGNOSIS — N401 Enlarged prostate with lower urinary tract symptoms: Secondary | ICD-10-CM | POA: Diagnosis not present

## 2016-11-06 LAB — URINALYSIS, COMPLETE
Bilirubin, UA: NEGATIVE
GLUCOSE, UA: NEGATIVE
Ketones, UA: NEGATIVE
Leukocytes, UA: NEGATIVE
Nitrite, UA: NEGATIVE
PH UA: 7 (ref 5.0–7.5)
PROTEIN UA: NEGATIVE
RBC UA: NEGATIVE
SPEC GRAV UA: 1.01 (ref 1.005–1.030)
Urobilinogen, Ur: 0.2 mg/dL (ref 0.2–1.0)

## 2016-11-06 LAB — MICROSCOPIC EXAMINATION: BACTERIA UA: NONE SEEN

## 2016-11-06 MED ORDER — CIPROFLOXACIN HCL 500 MG PO TABS
500.0000 mg | ORAL_TABLET | Freq: Once | ORAL | Status: AC
  Administered 2016-11-06: 500 mg via ORAL

## 2016-11-06 MED ORDER — LIDOCAINE HCL 2 % EX GEL
1.0000 "application " | Freq: Once | CUTANEOUS | Status: AC
  Administered 2016-11-06: 1 via URETHRAL

## 2016-11-06 NOTE — Progress Notes (Signed)
   11/06/16  CC:  Chief Complaint  Patient presents with  . Cysto    BPH w/ urinary obstruction     HPI:  Blood pressure 139/80, pulse (!) 52, height 5\' 10"  (1.778 m), weight 66.3 kg (146 lb 1.6 oz). NED. A&Ox3.   No respiratory distress   Abd soft, NT, ND Normal phallus with bilateral descended testicles  Cystoscopy Procedure Note  Patient identification was confirmed, informed consent was obtained, and patient was prepped using Betadine solution.  Lidocaine jelly was administered per urethral meatus.    Preoperative abx where received prior to procedure.     Pre-Procedure: - Inspection reveals a normal caliber ureteral meatus.  Procedure: The flexible cystoscope was introduced without difficulty - No urethral strictures/lesions are present. - Enlarged prostate With a short prostatic urethra, obstructing lateral lobes, and a narrow bladder neck. - Tight bladder neck - Bilateral ureteral orifices identified - Bladder mucosa  reveals no ulcers, tumors, or lesions - No bladder stones - No trabeculation  Retroflexion shows no intra-vesicle prostatic protrusion.   Post-Procedure: - Patient tolerated the procedure well  Assessment/ Plan: The patient has an obstructive bladder neck with obstructing lateral lobes. He has a very small median lobe. I discussed the findings with the patient in significant detail. We discussed treatment options. Essentially, the patient has failed medical management and is here to proceed with a bladder outlet procedure that will reduce his postvoid dribbling, nocturia, and obstructive voiding symptoms.  I discussed with the patient both TURP and holmium laser prostate ablation. I explained to the patient the surgeries in detail. The patient understands that he will be admitted for overnight observation. He understands that following the procedure he will have a catheter that we will try to remove on postop day #1. The patient also understands  that following this procedure he is likely to. Retrograde ejaculation. I informed him that there was very little risk of erectile dysfunction and urinary incontinence.  The patient does have a history of myocardial infarction 7 years ago. He also is epileptic. He would likely be beneficial for the patient to get clearance from his primary care provider given his associated comorbidities.

## 2016-11-07 ENCOUNTER — Other Ambulatory Visit: Payer: Self-pay | Admitting: Radiology

## 2016-11-07 ENCOUNTER — Telehealth: Payer: Self-pay | Admitting: Radiology

## 2016-11-07 DIAGNOSIS — N320 Bladder-neck obstruction: Secondary | ICD-10-CM

## 2016-11-07 NOTE — Telephone Encounter (Signed)
Notified pt of surgical clearance appt with Dr Ubaldo Glassing on 11/16/16 @2 :00, surgery scheduled with Dr Erlene Quan on 11/20/16 & pre-admit testing appt on 11/13/16 @10 :00. Pt voices understanding.

## 2016-11-09 ENCOUNTER — Inpatient Hospital Stay: Admission: RE | Admit: 2016-11-09 | Payer: Medicaid Other | Source: Ambulatory Visit

## 2016-11-10 LAB — CULTURE, URINE COMPREHENSIVE

## 2016-11-13 ENCOUNTER — Encounter
Admission: RE | Admit: 2016-11-13 | Discharge: 2016-11-13 | Disposition: A | Discharge status: Home / Self Care | Payer: Medicaid Other | Source: Ambulatory Visit | Attending: Urology | Admitting: Urology

## 2016-11-13 DIAGNOSIS — Z01812 Encounter for preprocedural laboratory examination: Secondary | ICD-10-CM | POA: Diagnosis present

## 2016-11-13 DIAGNOSIS — I10 Essential (primary) hypertension: Secondary | ICD-10-CM | POA: Diagnosis not present

## 2016-11-13 DIAGNOSIS — Z01810 Encounter for preprocedural cardiovascular examination: Secondary | ICD-10-CM | POA: Insufficient documentation

## 2016-11-13 DIAGNOSIS — I2510 Atherosclerotic heart disease of native coronary artery without angina pectoris: Secondary | ICD-10-CM | POA: Diagnosis not present

## 2016-11-13 HISTORY — DX: Acute myocardial infarction, unspecified: I21.9

## 2016-11-13 HISTORY — DX: Cardiac murmur, unspecified: R01.1

## 2016-11-13 LAB — CBC
HEMATOCRIT: 37.5 % — AB (ref 40.0–52.0)
HEMOGLOBIN: 12.6 g/dL — AB (ref 13.0–18.0)
MCH: 30.6 pg (ref 26.0–34.0)
MCHC: 33.5 g/dL (ref 32.0–36.0)
MCV: 91.2 fL (ref 80.0–100.0)
Platelets: 171 10*3/uL (ref 150–440)
RBC: 4.11 MIL/uL — ABNORMAL LOW (ref 4.40–5.90)
RDW: 14.7 % — AB (ref 11.5–14.5)
WBC: 4.6 10*3/uL (ref 3.8–10.6)

## 2016-11-13 NOTE — Patient Instructions (Signed)
Your procedure is scheduled on: Tuesday  11/20/16 Report to Day Surgery. 2ND FLOOR MEDICAL MALL ENTRANCE To find out your arrival time please call [phone] between 1PM - 3PM on Monday 11/19/16.  Remember: Instructions that are not followed completely may result in serious medical risk, up to and including death, or upon the discretion of your surgeon and anesthesiologist your surgery may need to be rescheduled.    __X__ 1. Do not eat food or drink liquids after midnight. No gum chewing or hard candies.     __X__ 2. No Alcohol for 24 hours before or after surgery.   ____ 3. Bring all medications with you on the day of surgery if instructed.    __X__ 4. Notify your doctor if there is any change in your medical condition     (cold, fever, infections).     Do not wear jewelry, make-up, hairpins, clips or nail polish.  Do not wear lotions, powders, or perfumes.   Do not shave 48 hours prior to surgery. Men may shave face and neck.  Do not bring valuables to the hospital.    [facility] is not responsible for any belongings or valuables.               Contacts, dentures or bridgework may not be worn into surgery.  Leave your suitcase in the car. After surgery it may be brought to your room.  For patients admitted to the hospital, discharge time is determined by your                treatment team.   Patients discharged the day of surgery will not be allowed to drive home.   Please read over the following fact sheets that you were given:   Pain Booklet   __X__ Take these medicines the morning of surgery with A SIP OF WATER:    1. CELEXA  2. LISINOPRIL  3. DILANTIN  4.  5.  6.  ____ Fleet Enema (as directed)   ____ Use CHG Soap as directed  ____ Use inhalers on the day of surgery  ____ Stop metformin 2 days prior to surgery    ____ Take 1/2 of usual insulin dose the night before surgery and none on the morning of surgery.   __X__ Stop Coumadin/Plavix/aspirin on ALREADY  STOPPED  ____ Stop Anti-inflammatories on    ____ Stop supplements until after surgery.    ____ Bring C-Pap to the hospital.

## 2016-11-13 NOTE — Pre-Procedure Instructions (Signed)
   ECG 12-lead4/18/2017 [place] Component Name Value Ref Range  Vent Rate (bpm) 46   PR Interval (msec) 236   QRS Interval (msec) 96   QT Interval (msec) 458   QTc (msec) 400   Result Narrative  Sinus bradycardia 1st degree AV block Early repolarization Otherwise normal ECG No previous ECGs available I reviewed and concur with this report. Electronically signed [id number] MD, KEN ([id number]) on 04/25/2016 8:04:00 AM

## 2016-11-15 ENCOUNTER — Encounter: Payer: Self-pay | Admitting: *Deleted

## 2016-11-15 NOTE — Pre-Procedure Instructions (Signed)
AS INSTRUCTED BY DR P CARROLL, EKG FAXED TO DR [facility] WHO IS TO SEE PATIENT 11/16/16.SPOKE WITH BETSY AND ALSO AMY AT DR Cherrie Gauze

## 2016-11-19 NOTE — Pre-Procedure Instructions (Signed)
CLEARED LOW RISK BY DR [facility] 11/16/16

## 2016-11-20 ENCOUNTER — Encounter
Admission: RE | Disposition: A | Discharge status: Home / Self Care | Payer: Self-pay | Source: Ambulatory Visit | Attending: Urology

## 2016-11-20 ENCOUNTER — Ambulatory Visit
Admission: RE | Admit: 2016-11-20 | Discharge: 2016-11-20 | Disposition: A | Discharge status: Home / Self Care | Payer: Medicaid Other | Source: Ambulatory Visit | Attending: Urology | Admitting: Urology

## 2016-11-20 ENCOUNTER — Ambulatory Visit: Payer: Medicaid Other | Admitting: Anesthesiology

## 2016-11-20 DIAGNOSIS — I2510 Atherosclerotic heart disease of native coronary artery without angina pectoris: Secondary | ICD-10-CM | POA: Diagnosis not present

## 2016-11-20 DIAGNOSIS — Z7982 Long term (current) use of aspirin: Secondary | ICD-10-CM | POA: Insufficient documentation

## 2016-11-20 DIAGNOSIS — Z791 Long term (current) use of non-steroidal anti-inflammatories (NSAID): Secondary | ICD-10-CM | POA: Insufficient documentation

## 2016-11-20 DIAGNOSIS — N138 Other obstructive and reflux uropathy: Secondary | ICD-10-CM | POA: Insufficient documentation

## 2016-11-20 DIAGNOSIS — N401 Enlarged prostate with lower urinary tract symptoms: Secondary | ICD-10-CM | POA: Insufficient documentation

## 2016-11-20 DIAGNOSIS — Z8673 Personal history of transient ischemic attack (TIA), and cerebral infarction without residual deficits: Secondary | ICD-10-CM | POA: Diagnosis not present

## 2016-11-20 DIAGNOSIS — Z87891 Personal history of nicotine dependence: Secondary | ICD-10-CM | POA: Diagnosis not present

## 2016-11-20 DIAGNOSIS — G40909 Epilepsy, unspecified, not intractable, without status epilepticus: Secondary | ICD-10-CM | POA: Insufficient documentation

## 2016-11-20 DIAGNOSIS — F329 Major depressive disorder, single episode, unspecified: Secondary | ICD-10-CM | POA: Diagnosis not present

## 2016-11-20 DIAGNOSIS — I252 Old myocardial infarction: Secondary | ICD-10-CM | POA: Insufficient documentation

## 2016-11-20 DIAGNOSIS — I10 Essential (primary) hypertension: Secondary | ICD-10-CM | POA: Diagnosis not present

## 2016-11-20 DIAGNOSIS — Z79899 Other long term (current) drug therapy: Secondary | ICD-10-CM | POA: Diagnosis not present

## 2016-11-20 DIAGNOSIS — N3289 Other specified disorders of bladder: Secondary | ICD-10-CM | POA: Diagnosis not present

## 2016-11-20 DIAGNOSIS — N320 Bladder-neck obstruction: Secondary | ICD-10-CM

## 2016-11-20 HISTORY — PX: PROSTATE ABLATION: SHX6042

## 2016-11-20 HISTORY — PX: TRANSURETHRAL RESECTION OF PROSTATE: SHX73

## 2016-11-20 HISTORY — PX: HOLMIUM LASER APPLICATION: SHX5852

## 2016-11-20 HISTORY — DX: Cerebral infarction, unspecified: I63.9

## 2016-11-20 HISTORY — DX: Unspecified convulsions: R56.9

## 2016-11-20 LAB — URINE DRUG SCREEN, QUALITATIVE (ARMC ONLY)
AMPHETAMINES, UR SCREEN: NOT DETECTED
BARBITURATES, UR SCREEN: NOT DETECTED
BENZODIAZEPINE, UR SCRN: NOT DETECTED
Cannabinoid 50 Ng, Ur ~~LOC~~: POSITIVE — AB
Cocaine Metabolite,Ur ~~LOC~~: NOT DETECTED
MDMA (Ecstasy)Ur Screen: NOT DETECTED
METHADONE SCREEN, URINE: NOT DETECTED
OPIATE, UR SCREEN: NOT DETECTED
Phencyclidine (PCP) Ur S: NOT DETECTED
TRICYCLIC, UR SCREEN: NOT DETECTED

## 2016-11-20 SURGERY — TURP (TRANSURETHRAL RESECTION OF PROSTATE)
Anesthesia: General | Wound class: Clean Contaminated

## 2016-11-20 MED ORDER — FAMOTIDINE 20 MG PO TABS
ORAL_TABLET | ORAL | Status: AC
  Administered 2016-11-20: 20 mg via ORAL
  Filled 2016-11-20: qty 1

## 2016-11-20 MED ORDER — FUROSEMIDE 10 MG/ML IJ SOLN
INTRAMUSCULAR | Status: DC | PRN
  Administered 2016-11-20: 10 mg via INTRAMUSCULAR

## 2016-11-20 MED ORDER — BELLADONNA ALKALOIDS-OPIUM 16.2-60 MG RE SUPP
1.0000 | Freq: Once | RECTAL | Status: AC
  Administered 2016-11-20: 1 via RECTAL

## 2016-11-20 MED ORDER — MIDAZOLAM HCL 2 MG/2ML IJ SOLN
INTRAMUSCULAR | Status: DC | PRN
  Administered 2016-11-20: 1 mg via INTRAVENOUS

## 2016-11-20 MED ORDER — HYDROCODONE-ACETAMINOPHEN 5-325 MG PO TABS: 1.0000 | ORAL_TABLET | Freq: Four times a day (QID) | ORAL | 0 refills | Status: DC | PRN

## 2016-11-20 MED ORDER — PROPOFOL 10 MG/ML IV BOLUS
INTRAVENOUS | Status: DC | PRN
  Administered 2016-11-20: 100 mg via INTRAVENOUS

## 2016-11-20 MED ORDER — LACTATED RINGERS IV SOLN
INTRAVENOUS | Status: DC
  Administered 2016-11-20 (×3): via INTRAVENOUS

## 2016-11-20 MED ORDER — ONDANSETRON HCL 4 MG/2ML IJ SOLN
INTRAMUSCULAR | Status: DC | PRN
  Administered 2016-11-20: 4 mg via INTRAVENOUS

## 2016-11-20 MED ORDER — FENTANYL CITRATE (PF) 100 MCG/2ML IJ SOLN
INTRAMUSCULAR | Status: AC
  Administered 2016-11-20: 25 ug via INTRAVENOUS
  Filled 2016-11-20: qty 2

## 2016-11-20 MED ORDER — ONDANSETRON HCL 4 MG/2ML IJ SOLN: 4.0000 mg | Freq: Once | INTRAMUSCULAR | Status: DC | PRN

## 2016-11-20 MED ORDER — DOCUSATE SODIUM 100 MG PO CAPS: 100.0000 mg | ORAL_CAPSULE | Freq: Two times a day (BID) | ORAL | 0 refills | Status: DC

## 2016-11-20 MED ORDER — CIPROFLOXACIN IN D5W 400 MG/200ML IV SOLN
400.0000 mg | INTRAVENOUS | Status: AC
Start: 2016-11-20 — End: 2016-11-20
  Administered 2016-11-20: 400 mg via INTRAVENOUS

## 2016-11-20 MED ORDER — BELLADONNA ALKALOIDS-OPIUM 16.2-60 MG RE SUPP
RECTAL | Status: AC
  Administered 2016-11-20: 1 via RECTAL
  Filled 2016-11-20: qty 1

## 2016-11-20 MED ORDER — LIDOCAINE HCL (CARDIAC) 20 MG/ML IV SOLN
INTRAVENOUS | Status: DC | PRN
  Administered 2016-11-20: 40 mg via INTRAVENOUS

## 2016-11-20 MED ORDER — CIPROFLOXACIN IN D5W 400 MG/200ML IV SOLN
INTRAVENOUS | Status: AC
  Administered 2016-11-20: 400 mg via INTRAVENOUS
  Filled 2016-11-20: qty 200

## 2016-11-20 MED ORDER — FAMOTIDINE 20 MG PO TABS
20.0000 mg | ORAL_TABLET | Freq: Once | ORAL | Status: AC
  Administered 2016-11-20: 20 mg via ORAL

## 2016-11-20 MED ORDER — FENTANYL CITRATE (PF) 100 MCG/2ML IJ SOLN
25.0000 ug | INTRAMUSCULAR | Status: DC | PRN
  Administered 2016-11-20 (×4): 25 ug via INTRAVENOUS

## 2016-11-20 MED ORDER — FENTANYL CITRATE (PF) 100 MCG/2ML IJ SOLN
INTRAMUSCULAR | Status: DC | PRN
  Administered 2016-11-20 (×2): 25 ug via INTRAVENOUS
  Administered 2016-11-20: 50 ug via INTRAVENOUS

## 2016-11-20 SURGICAL SUPPLY — 24 items
ADAPTER IRRIG TUBE 2 SPIKE SOL (ADAPTER) IMPLANT
BAG DRAIN CYSTO-URO LG1000N (MISCELLANEOUS) ×2 IMPLANT
BAG URO DRAIN 4000ML (MISCELLANEOUS) ×2 IMPLANT
CATH FOL 2WAY LX 20X30 (CATHETERS) ×2 IMPLANT
CATH FOL 2WAY LX 24X30 (CATHETERS) IMPLANT
CATH FOL LEG HOLDER (MISCELLANEOUS) ×2 IMPLANT
CATH URETL 5X70 OPEN END (CATHETERS) ×2 IMPLANT
DRAPE UTILITY 15X26 TOWEL STRL (DRAPES) ×2 IMPLANT
ELECT LOOP 22F BIPOLAR SML (ELECTROSURGICAL)
ELECTRODE LOOP 22F BIPOLAR SML (ELECTROSURGICAL) IMPLANT
GLOVE BIO SURGEON STRL SZ 6.5 (GLOVE) ×2 IMPLANT
GOWN STRL REUS W/ TWL LRG LVL3 (GOWN DISPOSABLE) ×2 IMPLANT
GOWN STRL REUS W/TWL LRG LVL3 (GOWN DISPOSABLE) ×2
KIT RM TURNOVER CYSTO AR (KITS) ×2 IMPLANT
LASER FIBER 550M SMARTSCOPE (Laser) ×2 IMPLANT
LOOP CUT BIPOLAR 24F LRG (ELECTROSURGICAL) IMPLANT
PACK CYSTO AR (MISCELLANEOUS) ×2 IMPLANT
SET IRRIG Y TYPE TUR BLADDER L (SET/KITS/TRAYS/PACK) ×2 IMPLANT
SET IRRIGATING DISP (SET/KITS/TRAYS/PACK) ×2 IMPLANT
SOL .9 NS 3000ML IRR  AL (IV SOLUTION) ×4
SOL .9 NS 3000ML IRR UROMATIC (IV SOLUTION) ×4 IMPLANT
SYR TOOMEY 50ML (SYRINGE) ×2 IMPLANT
SYRINGE IRR TOOMEY STRL 70CC (SYRINGE) ×2 IMPLANT
WATER STERILE IRR 1000ML POUR (IV SOLUTION) ×2 IMPLANT

## 2016-11-20 NOTE — Discharge Instructions (Signed)
AMBULATORY SURGERY  DISCHARGE INSTRUCTIONS   1) The drugs that you were given will stay in your system until tomorrow so for the next 24 hours you should not:  A) Drive an automobile B) Make any legal decisions C) Drink any alcoholic beverage   2) You may resume regular meals tomorrow.  Today it is better to start with liquids and gradually work up to solid foods.  You may eat anything you prefer, but it is better to start with liquids, then soup and crackers, and gradually work up to solid foods.   3) Please notify your doctor immediately if you have any unusual bleeding, trouble breathing, redness and pain at the surgery site, drainage, fever, or pain not relieved by medication.    4) Additional Instructions:        Please contact your physician with any problems or Same Day Surgery at [phone], Monday through Friday 6 am to 4 pm, or [place] at [place] number at [phone].Foley Catheter Care, Adult A Foley catheter is a soft, flexible tube. This tube is placed into your bladder to drain pee (urine). If you go home with this catheter in place, follow the instructions below. TAKING CARE OF THE CATHETER 1. Wash your hands with soap and water. 2. Put soap and water on a clean washcloth.  Clean the skin where the tube goes into your body.  Clean away from the tube site.  Never wipe toward the tube.  Clean the area using a circular motion.  Remove all the soap. Pat the area dry with a clean towel. For males, reposition the skin that covers the end of the penis (foreskin). 3. Attach the tube to your leg with tape or a leg strap. Do not stretch the tube tight. If you are using tape, remove any stickiness left behind by past tape you used. 4. Keep the drainage bag below your hips. Keep it off the floor. 5. Check your tube during the day. Make sure it is working and draining. Make sure the tube does not curl, twist, or bend. 6. Do not pull on the tube or try to  take it out. TAKING CARE OF THE DRAINAGE BAGS You will have a large overnight drainage bag and a small leg bag. You may wear the overnight bag any time. Never wear the small bag at night. Follow the directions below. Emptying the Drainage Bag  Empty your drainage bag when it is ?- full or at least 2-3 times a day. 1. Wash your hands with soap and water. 2. Keep the drainage bag below your hips. 3. Hold the dirty bag over the toilet or clean container. 4. Open the pour spout at the bottom of the bag. Empty the pee into the toilet or container. Do not let the pour spout touch anything. 5. Clean the pour spout with a gauze pad or cotton ball that has rubbing alcohol on it. 6. Close the pour spout. 7. Attach the bag to your leg with tape or a leg strap. 8. Wash your hands well. Changing the Drainage Bag  Change your bag once a month or sooner if it starts to smell or look dirty.  1. Wash your hands with soap and water. 2. Pinch the rubber tube so that pee does not spill out. 3. Disconnect the catheter tube from the drainage tube at the connection valve. Do not let the tubes touch anything. 4. Clean the end of the catheter tube with an alcohol wipe. Clean the  end of a the drainage tube with a different alcohol wipe. 5. Connect the catheter tube to the drainage tube of the clean drainage bag. 6. Attach the new bag to the leg with tape or a leg strap. Avoid attaching the new bag too tightly. 7. Wash your hands well. Cleaning the Drainage Bag  1. Wash your hands with soap and water. 2. Wash the bag in warm, soapy water. 3. Rinse the bag with warm water. 4. Fill the bag with a mixture of white vinegar and water (1 cup vinegar to 1 quart warm water [.2 liter vinegar to 1 liter warm water]). Close the bag and soak it for 30 minutes in the solution. 5. Rinse the bag with warm water. 6. Hang the bag to dry with the pour spout open and hanging downward. 7. Store the clean bag (once it is dry) in a  clean plastic bag. 8. Wash your hands well. PREVENT INFECTION  Wash your hands before and after touching your tube.  Take showers every day. Wash the skin where the tube enters your body. Do not take baths. Replace wet leg straps with dry ones, if this applies.  Do not use powders, sprays, or lotions on the genital area. Only use creams, lotions, or ointments as told by your doctor.  For females, wipe from front to back after going to the bathroom.  Drink enough fluids to keep your pee clear or pale yellow unless you are told not to have too much fluid (fluid restriction).  Do not let the drainage bag or tubing touch or lie on the floor.  Wear cotton underwear to keep the area dry. GET HELP IF:  Your pee is cloudy or smells unusually bad.  Your tube becomes clogged.  You are not draining pee into the bag or your bladder feels full.  Your tube starts to leak. GET HELP RIGHT AWAY IF:  You have pain, puffiness (swelling), redness, or yellowish-white fluid (pus) where the tube enters the body.  You have pain in the belly (abdomen), legs, lower back, or bladder.  You have a fever.  You see blood fill the tube, or your pee is pink or red.  You feel sick to your stomach (nauseous), throw up (vomit), or have chills.  Your tube gets pulled out. MAKE SURE YOU:   Understand these instructions.  Will watch your condition.  Will get help right away if you are not doing well or get worse. This information is not intended to replace advice given to you by your health care provider. Make sure you discuss any questions you have with your health care provider. Document Released: 04/13/2013 Document Revised: 01/07/2015 Document Reviewed: 12/03/2015 Elsevier Interactive Patient Education  2017 [employer].

## 2016-11-20 NOTE — Anesthesia Preprocedure Evaluation (Addendum)
Anesthesia Evaluation  Patient identified by MRN, date of birth, ID band Patient awake    Reviewed: Allergy & Precautions, NPO status , Patient's Chart, lab work & pertinent test results, reviewed documented beta blocker date and time   Airway Mallampati: III  TM Distance: >3 FB     Dental  (+) Chipped, Dental Advisory Given, Poor Dentition, Missing, Partial Lower, Partial Upper   Pulmonary former smoker,           Cardiovascular hypertension, Pt. on medications + CAD and + Past MI  + Valvular Problems/Murmurs      Neuro/Psych  Headaches, Seizures -, Well Controlled,  PSYCHIATRIC DISORDERS Depression  Neuromuscular disease CVA    GI/Hepatic   Endo/Other    Renal/GU      Musculoskeletal  (+) Arthritis ,   Abdominal   Peds  (+) NICU stay Hematology  (+) anemia ,   Anesthesia Other Findings EKG reviewed. Early repolarization. No cardiac symptoms. Will proceed. CVA mostly resolved, some numbness in fingers. Epilepsy 4-5 yrs ago.  Reproductive/Obstetrics                           Anesthesia Physical Anesthesia Plan  ASA: III  Anesthesia Plan: General   Post-op Pain Management:    Induction: Intravenous  Airway Management Planned: Oral ETT  Additional Equipment:   Intra-op Plan:   Post-operative Plan:   Informed Consent: I have reviewed the patients History and Physical, chart, labs and discussed the procedure including the risks, benefits and alternatives for the proposed anesthesia with the patient or authorized representative who has indicated his/her understanding and acceptance.     Plan Discussed with: CRNA  Anesthesia Plan Comments:         Anesthesia Quick Evaluation

## 2016-11-20 NOTE — Anesthesia Postprocedure Evaluation (Signed)
Anesthesia Post Note  Patient: Kenneth Daniels  Procedure(s) Performed: Procedure(s) (LRB): TRANSURETHRAL RESECTION OF THE PROSTATE (TURP) (N/A) HOLMIUM LASER APPLICATION (N/A) PROSTATE ABLATION (N/A)  Patient location during evaluation: PACU Anesthesia Type: General Level of consciousness: awake and alert Pain management: pain level controlled Vital Signs Assessment: post-procedure vital signs reviewed and stable Respiratory status: spontaneous breathing, nonlabored ventilation, respiratory function stable and patient connected to nasal cannula oxygen Cardiovascular status: blood pressure returned to baseline and stable Postop Assessment: no signs of nausea or vomiting Anesthetic complications: no    Last Vitals:  Vitals:   11/20/16 1234 11/20/16 1243  BP: (!) 154/82 (!) 164/69  Pulse: (!) 57 (!) 52  Resp: 17 18  Temp:  36.8 C    Last Pain:  Vitals:   11/20/16 1243  TempSrc: Oral  PainSc:                  Shaleen Talamantez S

## 2016-11-20 NOTE — Op Note (Signed)
Date of procedure: 11/20/16  Preoperative diagnosis:  1. Bladder outlet obstruction   Postoperative diagnosis:  1. Same as above   Procedure: 1. Transurethral laser incision of the prostate  Surgeon: Hollice Espy, MD  Anesthesia: General  Complications: None  Intraoperative findings: Tight bladder neck with very small prostate, short prosthetic length  EBL: Minimal   Specimens: None  Drains: 42 French two-way Foley catheter  Indication: Kenneth Daniels is a 60 y.o. patient with bladder outlet obstruction/ obstructive voiding symptoms found have a relatively tight bladder neck with trabeculated bladder without significant median lobe component.  After reviewing the management options for treatment, he elected to proceed with the above surgical procedure(s). We have discussed the potential benefits and risks of the procedure, side effects of the proposed treatment, the likelihood of the patient achieving the goals of the procedure, and any potential problems that might occur during the procedure or recuperation. Informed consent has been obtained.  Description of procedure:  The patient was taken to the operating room and general anesthesia was induced.  The patient was placed in the dorsal lithotomy position, prepped and draped in the usual sterile fashion, and preoperative antibiotics were administered. A preoperative time-out was performed.   A 26 French resectoscope sheath was advanced per urethra into the bladder using a coud tip blunt obturator. At this point in time, the 30 lens was brought in and the bladder was carefully inspected. The bladder neck was very mildly elevated with a moderately trabeculated bladder. The trigone was a good distance from the bladder neck. The UOs were in orthotopic position and draining clear yellow urine. Upon inspection of the prostatic fossa, it was noted to have a relatively short length, no greater than 2.5 cm without significant coaptation.  The bladder neck itself was quite tight, not much greater than the 26 French resectoscope which was relatively tight. Based on his anatomy, it was deemed that the patient would benefit most from laser incision of the bladder neck/ prostate. This point time, a 500  holmium laser fiber was brought in and using the settings of a proximal 2 J and 40 Hz, 2 incisions were created at the 5:00 and 7:00 positions at the bladder neck. These incisions were carried down to bladder neck fibers. They were widened to each side laterally to open up the bladder neck increased channel for irrigation. The incisions were then brought down together to me in the midline just proximal to the verumontanum. A very small amount of intervening median lobe tissue was ablated between the 2 incisions. This opened up the bladder neck quite nicely through which the scope was able to traverse easily. At this point time, the bladder neck was widely patent. There is no significant bleeding appreciated. The UOs were carefully reinspected and a good distance without injury from the bladder neck. This point time, the scope was removed. A 20 French two-way Foley catheter was placed. The patient was administered 10 mg of IV Lasix to help with diuresis. He was then repositioned the supine position, reversed from anesthesia, and taken to the PACU in stable condition. There were no complications in this case.  Plan: Patient will remove his own Foley catheter tomorrow. He'll call our office exam able to do so and understands our office close at noon tomorrow for the holiday. He will follow up in 6 weeks possibly with uroflow as possible and PVR. Hollice Espy, M.D.

## 2016-11-20 NOTE — Transfer of Care (Signed)
Immediate Anesthesia Transfer of Care Note  Patient: Kenneth Daniels  Procedure(s) Performed: Procedure(s): TRANSURETHRAL RESECTION OF THE PROSTATE (TURP) (N/A) HOLMIUM LASER APPLICATION (N/A) PROSTATE ABLATION (N/A)  Patient Location: PACU  Anesthesia Type:General  Level of Consciousness: awake  Airway & Oxygen Therapy: Patient Spontanous Breathing and Patient connected to face mask oxygen  Post-op Assessment: Report given to RN and Post -op Vital signs reviewed and stable  Post vital signs: Reviewed and stable  Last Vitals:  Vitals:   11/20/16 0934 11/20/16 1132  BP: (!) 149/77 (!) 202/92  Pulse: (!) 49 66  Resp: 16 17  Temp: 36.4 C 36.3 C    Last Pain:  Vitals:   11/20/16 0934  TempSrc: Tympanic         Complications: No apparent anesthesia complications

## 2016-11-20 NOTE — Interval H&P Note (Signed)
History and Physical Interval Note:  11/20/2016 10:24 AM  Kenneth Daniels  has presented today for surgery, with the diagnosis of BLADDER OUTLET OBSTRUCTION  The various methods of treatment have been discussed with the patient and family. After consideration of risks, benefits and other options for treatment, the patient has consented to  Procedure(s): TRANSURETHRAL RESECTION OF THE PROSTATE (TURP) (N/A) HOLMIUM LASER APPLICATION (N/A) PROSTATE ABLATION (N/A) as a surgical intervention .  The patient's history has been reviewed, patient examined, no change in status, stable for surgery.  I have reviewed the patient's chart and labs.  Questions were answered to the patient's satisfaction.    RRR CTAB  Preop UCx negative  Hollice Espy

## 2016-11-20 NOTE — Anesthesia Procedure Notes (Signed)
Procedure Name: LMA Insertion Date/Time: 11/20/2016 10:40 AM Performed by: Allean Found Pre-anesthesia Checklist: Patient identified, Emergency Drugs available, Suction available, Patient being monitored and Timeout performed Patient Re-evaluated:Patient Re-evaluated prior to inductionOxygen Delivery Method: Circle system utilized Preoxygenation: Pre-oxygenation with 100% oxygen Intubation Type: IV induction Ventilation: Mask ventilation without difficulty LMA: LMA inserted LMA Size: 5.0 Number of attempts: 1 Placement Confirmation: positive ETCO2 Tube secured with: Tape Dental Injury: Teeth and Oropharynx as per pre-operative assessment

## 2016-11-20 NOTE — H&P (View-Only) (Signed)
   11/06/16  CC:  Chief Complaint  Patient presents with  . Cysto    BPH w/ urinary obstruction     HPI:  Blood pressure 139/80, pulse (!) 52, height 5\' 10"  (1.778 m), weight 66.3 kg (146 lb 1.6 oz). NED. A&Ox3.   No respiratory distress   Abd soft, NT, ND Normal phallus with bilateral descended testicles  Cystoscopy Procedure Note  Patient identification was confirmed, informed consent was obtained, and patient was prepped using Betadine solution.  Lidocaine jelly was administered per urethral meatus.    Preoperative abx where received prior to procedure.     Pre-Procedure: - Inspection reveals a normal caliber ureteral meatus.  Procedure: The flexible cystoscope was introduced without difficulty - No urethral strictures/lesions are present. - Enlarged prostate With a short prostatic urethra, obstructing lateral lobes, and a narrow bladder neck. - Tight bladder neck - Bilateral ureteral orifices identified - Bladder mucosa  reveals no ulcers, tumors, or lesions - No bladder stones - No trabeculation  Retroflexion shows no intra-vesicle prostatic protrusion.   Post-Procedure: - Patient tolerated the procedure well  Assessment/ Plan: The patient has an obstructive bladder neck with obstructing lateral lobes. He has a very small median lobe. I discussed the findings with the patient in significant detail. We discussed treatment options. Essentially, the patient has failed medical management and is here to proceed with a bladder outlet procedure that will reduce his postvoid dribbling, nocturia, and obstructive voiding symptoms.  I discussed with the patient both TURP and holmium laser prostate ablation. I explained to the patient the surgeries in detail. The patient understands that he will be admitted for overnight observation. He understands that following the procedure he will have a catheter that we will try to remove on postop day #1. The patient also understands  that following this procedure he is likely to. Retrograde ejaculation. I informed him that there was very little risk of erectile dysfunction and urinary incontinence.  The patient does have a history of myocardial infarction 7 years ago. He also is epileptic. He would likely be beneficial for the patient to get clearance from his primary care provider given his associated comorbidities.

## 2016-11-21 ENCOUNTER — Telehealth: Payer: Self-pay

## 2016-11-21 NOTE — Telephone Encounter (Signed)
Pt called requesting reinforcement of how to remove catheter. Reinforced with pt how to remove water from balloon and remove catheter. Pt voiced understanding.

## 2017-01-09 ENCOUNTER — Ambulatory Visit: Payer: Medicaid Other | Admitting: Urology

## 2017-01-11 ENCOUNTER — Encounter: Payer: Self-pay | Admitting: Urology

## 2017-01-11 ENCOUNTER — Ambulatory Visit (INDEPENDENT_AMBULATORY_CARE_PROVIDER_SITE_OTHER): Payer: Medicaid Other | Admitting: Urology

## 2017-01-11 VITALS — BP 129/82 | HR 77 | Ht 70.0 in | Wt 146.0 lb

## 2017-01-11 DIAGNOSIS — Z125 Encounter for screening for malignant neoplasm of prostate: Secondary | ICD-10-CM

## 2017-01-11 DIAGNOSIS — N400 Enlarged prostate without lower urinary tract symptoms: Secondary | ICD-10-CM

## 2017-01-11 LAB — URINALYSIS, COMPLETE
BILIRUBIN UA: NEGATIVE
GLUCOSE, UA: NEGATIVE
LEUKOCYTES UA: NEGATIVE
Nitrite, UA: NEGATIVE
PH UA: 5.5 (ref 5.0–7.5)
RBC UA: NEGATIVE
Specific Gravity, UA: 1.025 (ref 1.005–1.030)
UUROB: 0.2 mg/dL (ref 0.2–1.0)

## 2017-01-11 LAB — MICROSCOPIC EXAMINATION: BACTERIA UA: NONE SEEN

## 2017-01-11 LAB — BLADDER SCAN AMB NON-IMAGING

## 2017-01-11 NOTE — Progress Notes (Signed)
01/11/2017 9:53 AM   Kenneth Daniels 1956/06/09 [id number]  Referring provider: Theotis Burrow, MD [address] [place], [place] [address]  Chief Complaint  Patient presents with  . Routine Post Op    HPI: 61 yo M s/p TULIP on 11/20/16 for bladder neck obstruction (tight bladder neck), obstructive voiding symptoms.  He returns today for post op evaluation.    His preop urinary symptoms included straining when urinating, sensation of incomplete bladder emptying, and urinary frequency along with nocturia 3.     Since surgery, he notes a dramatic improvement In his urinary stream and inability to empty his bladder. His flow is improved. He's not getting up as frequently and nighttime. Overall, he is very pleased. He does have some new urgency but relates this to his lisinopril. He is not having any incontinence.    IPSS improved 8/1.  Previously 24/2 preop.    He is currently on Flomax and has been for several years and finasteride 5 mg daily was started in 03/2016.    Recent PSA was 1.2 ng/mL 2023-05-13.   + cousin died of prostate cancer, age 72.   PVR today 0.       IPSS    Row Name 01/11/17 0900         International Prostate Symptom Score   How often have you had the sensation of not emptying your bladder? Less than 1 in 5     How often have you had to urinate less than every two hours? Less than 1 in 5 times     How often have you found you stopped and started again several times when you urinated? Not at All     How often have you found it difficult to postpone urination? About half the time     How often have you had a weak urinary stream? Less than 1 in 5 times     How often have you had to strain to start urination? Not at All     How many times did you typically get up at night to urinate? 2 Times     Total IPSS Score 8       Quality of Life due to urinary symptoms   If you were to spend the rest of your life with your urinary condition just the  way it is now how would you feel about that? Delighted        PMH: Past Medical History:  Diagnosis Date  . 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 Atypical chest pain 04/17/2016  . CAD in native artery 04/17/2016  . Cerebral infarction ([place]) 11/25/2013   Overview:  IMO Problem List Replacer Jan. 2016   . Cervical spinal stenosis 07/06/2015  . Essential (primary) hypertension 04/15/2008  . 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 Headache, migraine 11/25/2013  . Heart murmur   . HLD (hyperlipidemia) 03/27/2012  . Idiopathic localized osteoarthropathy 02/26/2011  . Major depressive disorder with single episode 11/14/2009  . Myocardial infarction   . 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 Seizures ([place])   . Stroke [facility])     Surgical History: Past Surgical History:  Procedure Laterality Date  . ABDOMINAL EXPLORATION SURGERY  2010?  [facility] BACK SURGERY    . HOLMIUM LASER APPLICATION N/A [id number]   Procedure: HOLMIUM LASER APPLICATION;  Surgeon: Hollice Espy, MD;  Location: [facility] ORS;  Service: Urology;  Laterality: N/A;  . PROSTATE ABLATION N/A 11/20/2016   Procedure: PROSTATE ABLATION;  Surgeon:  Hollice Espy, MD;  Location: [facility] ORS;  Service: Urology;  Laterality: N/A;  . SOFT TISSUE TUMOR RESECTION     left thigh  . TRANSURETHRAL RESECTION OF PROSTATE N/A 11/20/2016   Procedure: TRANSURETHRAL RESECTION OF THE PROSTATE (TURP);  Surgeon: Hollice Espy, MD;  Location: [facility] ORS;  Service: Urology;  Laterality: N/A;    Home Medications:  Allergies as of 01/11/2017      Reactions   Sulfa Antibiotics    Other reaction(s): RASH      Medication List       Accurate as of 01/11/17  9:53 AM. Always use your most recent med list.          aspirin EC 81 MG tablet Take 81 mg by mouth daily.   cholecalciferol 1000 units tablet Commonly known as:  VITAMIN D Take 1,000 Units by mouth daily.   citalopram 40 MG tablet Commonly known as:  CELEXA TAKE 1 TABLET DAILY BY MOUTH     docusate sodium 100 MG capsule Commonly known as:  COLACE Take 1 capsule (100 mg total) by mouth 2 (two) times daily.   eucerin cream Apply 1 application topically as needed for dry skin.   HYDROcodone-acetaminophen 5-325 MG tablet Commonly known as:  NORCO/VICODIN Take 1-2 tablets by mouth every 6 (six) hours as needed for moderate pain.   lisinopril 10 MG tablet Commonly known as:  PRINIVIL,ZESTRIL TAKE 1 TABLET BY MOUTH DAILY FOR HIGH BP   meloxicam 15 MG tablet Commonly known as:  MOBIC Take 15 mg by mouth daily.   phenytoin 100 MG ER capsule Commonly known as:  DILANTIN TAKE 3 CAPSULES IN THE MORNING AND 2 CAPSULES EVERY EVENING   simvastatin 20 MG tablet Commonly known as:  ZOCOR TAKE 1 TABLET EVERY EVENING FOR HIGH CHOLESTEROL   tamsulosin 0.4 MG Caps capsule Commonly known as:  FLOMAX TAKE 2 CAPSULES 30 MINUTES AFTER THE SAME MEAL BEFORE BED   triamcinolone 0.025 % cream Commonly known as:  KENALOG Apply 1 application topically daily as needed (rash).       Allergies:  Allergies  Allergen Reactions  . Sulfa Antibiotics     Other reaction(s): RASH    Family History: Family History  Problem Relation Age of Onset  . Bladder Cancer Neg Hx   . Prostate cancer Neg Hx   . Kidney cancer Neg Hx     Social History:  reports that he has quit smoking. He has never used smokeless tobacco. He reports that he uses drugs, including Marijuana. He reports that he does not drink alcohol.  ROS: UROLOGY Frequent Urination?: No Hard to postpone urination?: Yes Burning/pain with urination?: No Get up at night to urinate?: No Leakage of urine?: Yes Urine stream starts and stops?: No Trouble starting stream?: No Do you have to strain to urinate?: No Blood in urine?: No Urinary tract infection?: No Sexually transmitted disease?: No Injury to kidneys or bladder?: No Painful intercourse?: No Weak stream?: No Erection problems?: No Penile pain?:  No  Gastrointestinal Nausea?: No Vomiting?: No Indigestion/heartburn?: No Diarrhea?: No Constipation?: No  Constitutional Fever: No Night sweats?: No Weight loss?: No Fatigue?: No  Skin Skin rash/lesions?: No Itching?: No  Eyes Blurred vision?: No Double vision?: No  Ears/Nose/Throat Sore throat?: No Sinus problems?: No  Hematologic/Lymphatic Swollen glands?: No Easy bruising?: No  Cardiovascular Leg swelling?: No Chest pain?: No  Respiratory Cough?: No Shortness of breath?: No  Endocrine Excessive thirst?: No  Musculoskeletal Back pain?: No Joint pain?: No  Neurological Headaches?: No Dizziness?: No  Psychologic Depression?: Yes Anxiety?: No  Physical Exam: BP 129/82   Pulse 77   Ht 5\' 10"  (1.778 m)   Wt 146 lb (66.2 kg)   BMI 20.95 kg/m   Constitutional:  Alert and oriented, No acute distress. HEENT: [place] AT, moist mucus membranes.  Trachea midline, no masses. Cardiovascular: No clubbing, cyanosis, or edema. Respiratory: Normal respiratory effort, no increased work of breathing. GI: Abdomen is soft, nontender, nondistended, no abdominal masses GU: No CVA tenderness. Skin: No rashes, bruises or suspicious lesions. Neurologic: Grossly intact, no focal deficits, moving all 4 extremities. Psychiatric: Normal mood and affect.  Laboratory Data: Lab Results  Component Value Date   WBC 4.6 11/13/2016   HGB 12.6 (L) 11/13/2016   HCT 37.5 (L) 11/13/2016   MCV 91.2 11/13/2016   PLT 171 11/13/2016    Lab Results  Component Value Date   CREATININE 1.41 (H) 09/24/2014     Urinalysis    Component Value Date/Time   COLORURINE Yellow 09/24/2014 1629   APPEARANCEUR Clear 11/06/2016 0935   LABSPEC 1.020 09/24/2014 1629   PHURINE 5.0 09/24/2014 1629   GLUCOSEU Negative 11/06/2016 0935   GLUCOSEU Negative 09/24/2014 1629   HGBUR 1+ 09/24/2014 1629   BILIRUBINUR Negative 11/06/2016 0935   BILIRUBINUR Negative 09/24/2014 1629   KETONESUR  Negative 09/24/2014 1629   PROTEINUR Negative 11/06/2016 0935   PROTEINUR 100 mg/dL 09/24/2014 1629   NITRITE Negative 11/06/2016 0935   NITRITE Negative 09/24/2014 1629   LEUKOCYTESUR Negative 11/06/2016 0935   LEUKOCYTESUR Negative 09/24/2014 1629    Pertinent Imaging: Results for orders placed or performed in visit on 01/11/17  BLADDER SCAN AMB NON-IMAGING  Result Value Ref Range   Scan Result 15ml     UA reviewed today, unremarkable, see EPIC   Assessment & Plan:    1. Benign prostatic hyperplasia, unspecified whether lower urinary tract symptoms present S/p TULIP, doing very well Stop finasteride Continue flomax, will reassess need for this in 6 months - Urinalysis, Complete - BLADDER SCAN AMB NON-IMAGING  2. Prostate cancer screening PSA/ DRE next visit  Return in about 6 months (around 07/11/2017) for PSA/ DRE/ IPSS/ PVR.  Hollice Espy, MD  [facility] [address] [place], [place] [address] [phone]

## 2017-05-17 ENCOUNTER — Other Ambulatory Visit: Payer: Self-pay | Admitting: Family Medicine

## 2017-05-17 DIAGNOSIS — M542 Cervicalgia: Secondary | ICD-10-CM

## 2017-05-30 ENCOUNTER — Ambulatory Visit
Admission: RE | Admit: 2017-05-30 | Discharge: 2017-05-30 | Disposition: A | Discharge status: Home / Self Care | Payer: Medicaid Other | Source: Ambulatory Visit | Attending: Family Medicine | Admitting: Family Medicine

## 2017-05-30 DIAGNOSIS — M47812 Spondylosis without myelopathy or radiculopathy, cervical region: Secondary | ICD-10-CM | POA: Diagnosis not present

## 2017-05-30 DIAGNOSIS — M542 Cervicalgia: Secondary | ICD-10-CM | POA: Insufficient documentation

## 2017-05-30 DIAGNOSIS — Z9889 Other specified postprocedural states: Secondary | ICD-10-CM | POA: Diagnosis not present

## 2017-05-30 DIAGNOSIS — M4802 Spinal stenosis, cervical region: Secondary | ICD-10-CM | POA: Insufficient documentation

## 2017-07-12 ENCOUNTER — Encounter: Payer: Self-pay | Admitting: Urology

## 2017-07-12 ENCOUNTER — Ambulatory Visit (INDEPENDENT_AMBULATORY_CARE_PROVIDER_SITE_OTHER): Payer: Medicaid Other | Admitting: Urology

## 2017-07-12 VITALS — BP 121/72 | HR 57 | Ht 70.0 in | Wt 146.0 lb

## 2017-07-12 DIAGNOSIS — N401 Enlarged prostate with lower urinary tract symptoms: Secondary | ICD-10-CM

## 2017-07-12 DIAGNOSIS — N138 Other obstructive and reflux uropathy: Secondary | ICD-10-CM | POA: Diagnosis not present

## 2017-07-12 DIAGNOSIS — N400 Enlarged prostate without lower urinary tract symptoms: Secondary | ICD-10-CM | POA: Diagnosis not present

## 2017-07-12 DIAGNOSIS — Z125 Encounter for screening for malignant neoplasm of prostate: Secondary | ICD-10-CM | POA: Diagnosis not present

## 2017-07-12 LAB — BLADDER SCAN AMB NON-IMAGING

## 2017-07-12 NOTE — Progress Notes (Signed)
07/12/2017 11:00 AM   Kenneth Daniels 1956/09/10 [id number]  Referring provider: Theotis Burrow, MD [address] [place], [place] [address]  Chief Complaint  Patient presents with  . Benign Prostatic Hypertrophy    HPI: 61 yo M s/p TULIP on 11/20/16 for bladder neck obstruction (tight bladder neck).  His preop urinary symptoms included straining when urinating, sensation of incomplete bladder emptying, and urinary frequency along with nocturia 3.     Since surgery, he notes a dramatic improvement In his urinary stream and inability to empty his bladder. His flow is improved. He's not getting up as frequently and nighttime. Overall, he is very pleased. He does continue to have urgency but no urge incontinence.     IPSS improved 11/2  Previously 24/2 preop.    Stopped finasteride and flomax after procedure.  No longer on BPH meds  Recent PSA was 1.2 ng/mL 2023/05/12.   + cousin died of prostate cancer, age 53.   PVR today 0.       IPSS    Row Name 07/12/17 1000         International Prostate Symptom Score   How often have you had the sensation of not emptying your bladder? Less than 1 in 5     How often have you had to urinate less than every two hours? Less than half the time     How often have you found you stopped and started again several times when you urinated? Less than 1 in 5 times     How often have you found it difficult to postpone urination? About half the time     How often have you had a weak urinary stream? Less than 1 in 5 times     How often have you had to strain to start urination? Less than 1 in 5 times     How many times did you typically get up at night to urinate? 2 Times     Total IPSS Score 11       Quality of Life due to urinary symptoms   If you were to spend the rest of your life with your urinary condition just the way it is now how would you feel about that? Mixed        PMH: Past Medical History:  Diagnosis Date  .  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 Atypical chest pain 04/17/2016  . CAD in native artery 04/17/2016  . Cerebral infarction ([place]) 11/25/2013   Overview:  IMO Problem List Replacer Jan. 2016   . Cervical spinal stenosis 07/06/2015  . Essential (primary) hypertension 04/15/2008  . 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 Headache, migraine 11/25/2013  . Heart murmur   . HLD (hyperlipidemia) 03/27/2012  . Idiopathic localized osteoarthropathy 02/26/2011  . Major depressive disorder with single episode 11/14/2009  . Myocardial infarction ([place])   . 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 Seizures ([place])   . Stroke [facility])     Surgical History: Past Surgical History:  Procedure Laterality Date  . ABDOMINAL EXPLORATION SURGERY  2010?  Marland Kitchen BACK SURGERY    . HOLMIUM LASER APPLICATION N/A [id number]   Procedure: HOLMIUM LASER APPLICATION;  Surgeon: Hollice Espy, MD;  Location: [facility] ORS;  Service: Urology;  Laterality: N/A;  . PROSTATE ABLATION N/A 11/20/2016   Procedure: PROSTATE ABLATION;  Surgeon: Hollice Espy, MD;  Location: [facility] ORS;  Service: Urology;  Laterality: N/A;  . SOFT TISSUE TUMOR RESECTION     left  thigh  . TRANSURETHRAL RESECTION OF PROSTATE N/A 11/20/2016   Procedure: TRANSURETHRAL RESECTION OF THE PROSTATE (TURP);  Surgeon: Hollice Espy, MD;  Location: [facility] ORS;  Service: Urology;  Laterality: N/A;    Home Medications:  Allergies as of 07/12/2017      Reactions   Sulfa Antibiotics    Other reaction(s): RASH      Medication List       Accurate as of 07/12/17 11:00 AM. Always use your most recent med list.          aspirin EC 81 MG tablet Take 81 mg by mouth daily.   cholecalciferol 1000 units tablet Commonly known as:  VITAMIN D Take 1,000 Units by mouth daily.   citalopram 40 MG tablet Commonly known as:  CELEXA TAKE 1 TABLET DAILY BY MOUTH   ibuprofen 800 MG tablet Commonly known as:  ADVIL,MOTRIN Take 800 mg by mouth.   lisinopril 10 MG  tablet Commonly known as:  PRINIVIL,ZESTRIL TAKE 1 TABLET BY MOUTH DAILY FOR HIGH BP   naproxen 500 MG tablet Commonly known as:  NAPROSYN Take 500 mg by mouth 2 (two) times daily with a meal.   phenytoin 100 MG ER capsule Commonly known as:  DILANTIN TAKE 3 CAPSULES IN THE MORNING AND 2 CAPSULES EVERY EVENING   simvastatin 20 MG tablet Commonly known as:  ZOCOR TAKE 1 TABLET EVERY EVENING FOR HIGH CHOLESTEROL       Allergies:  Allergies  Allergen Reactions  . Sulfa Antibiotics     Other reaction(s): RASH    Family History: Family History  Problem Relation Age of Onset  . Bladder Cancer Neg Hx   . Prostate cancer Neg Hx   . Kidney cancer Neg Hx     Social History:  reports that he has quit smoking. He has never used smokeless tobacco. He reports that he uses drugs, including Marijuana. He reports that he does not drink alcohol.  ROS: UROLOGY Frequent Urination?: No Hard to postpone urination?: No Burning/pain with urination?: No Get up at night to urinate?: No Leakage of urine?: No Urine stream starts and stops?: No Trouble starting stream?: No Do you have to strain to urinate?: No Blood in urine?: No Urinary tract infection?: No Sexually transmitted disease?: No Injury to kidneys or bladder?: No Painful intercourse?: No Weak stream?: No Erection problems?: No Penile pain?: No  Gastrointestinal Nausea?: No Vomiting?: No Indigestion/heartburn?: No Diarrhea?: No Constipation?: No  Constitutional Fever: No Night sweats?: No Weight loss?: No Fatigue?: No  Skin Skin rash/lesions?: No Itching?: No  Eyes Blurred vision?: No Double vision?: No  Ears/Nose/Throat Sore throat?: No Sinus problems?: No  Hematologic/Lymphatic Swollen glands?: No Easy bruising?: No  Cardiovascular Leg swelling?: No Chest pain?: No  Respiratory Cough?: No Shortness of breath?: No  Endocrine Excessive thirst?: No  Musculoskeletal Back pain?: Yes Joint  pain?: No  Neurological Headaches?: No Dizziness?: No  Psychologic Depression?: Yes Anxiety?: Yes  Physical Exam: BP 121/72   Pulse (!) 57   Ht 5\' 10"  (1.778 m)   Wt 146 lb (66.2 kg)   BMI 20.95 kg/m   Constitutional:  Alert and oriented, No acute distress. HEENT: [place] AT, moist mucus membranes.  Trachea midline, no masses. Cardiovascular: No clubbing, cyanosis, or edema. Respiratory: Normal respiratory effort, no increased work of breathing. GI: Abdomen is soft, nontender, nondistended, no abdominal masses GU: No CVA tenderness. Rectal: Normal sphincter tone, 30 cc prostate, no masses.   Skin: No rashes, bruises or suspicious lesions.  Neurologic: Grossly intact, no focal deficits, moving all 4 extremities. Psychiatric: Normal mood and affect.  Laboratory Data: Lab Results  Component Value Date   WBC 4.6 11/13/2016   HGB 12.6 (L) 11/13/2016   HCT 37.5 (L) 11/13/2016   MCV 91.2 11/13/2016   PLT 171 11/13/2016    Lab Results  Component Value Date   CREATININE 1.41 (H) 09/24/2014    Urinalysis N/a  Pertinent Imaging: Results for orders placed or performed in visit on 07/12/17  BLADDER SCAN AMB NON-IMAGING  Result Value Ref Range   Scan Result 20ml     Assessment & Plan:    1. Benign prostatic hyperplasia, unspecified whether lower urinary tract symptoms present S/p TULIP,continues to do very well No longer on BPH meds Excellent emptying Some urgency/ frequency but minimal bother - BLADDER SCAN AMB NON-IMAGING  2. Prostate cancer screening PSA drawn today- will call with results Rectal exam unremarkable Expect to see some rise in his PSA from 1.2 given his no longer on finasteride   Return in about 1 year (around 07/12/2018) for PSA/ DRE.  Hollice Espy, MD  [facility] [place], [place] [address] [phone]

## 2017-07-13 LAB — PSA: Prostate Specific Ag, Serum: 1.2 ng/mL (ref 0.0–4.0)

## 2017-07-15 ENCOUNTER — Telehealth: Payer: Self-pay

## 2017-07-15 DIAGNOSIS — R972 Elevated prostate specific antigen [PSA]: Secondary | ICD-10-CM

## 2017-07-15 NOTE — Telephone Encounter (Signed)
Patient notified will keep follow up for one year, lab order placed

## 2017-07-15 NOTE — Telephone Encounter (Signed)
-----   Message from Hollice Espy, MD sent at 07/13/2017  1:03 PM EDT ----- PSA is excellent.  REcheck in 1 year.    Hollice Espy, MD

## 2017-07-29 ENCOUNTER — Telehealth: Payer: Self-pay

## 2017-07-29 NOTE — Telephone Encounter (Signed)
Pt walked into clinic stating "because of yall im not going to be able to have my surgery on 08/14/17". Once again made pt aware he is not having surgery with BUA per our records. Pt then pulled out at [facility]surgery business card. Directed pt to [facility]. Pt voiced understanding.

## 2017-07-29 NOTE — Telephone Encounter (Signed)
Pt and sister called stating they are under the impression pt will be having surgery on 08/14/17 and was needing more information. Nurse did not note any surgery scheduled or documentation for an upcoming surgery. Nurse spoke with Amy, surgery coordinator, who also did not have anything on file. Made pt and sister aware there was surgery for [facility] currently scheduled. Read pt and sister Dr. Erlene Quan dictation from 07/12/17 OV. Sister stated "Maybe we have the wrong doctor but will call you back if need be."

## 2017-08-30 ENCOUNTER — Encounter
Admission: RE | Admit: 2017-08-30 | Discharge: 2017-08-30 | Disposition: A | Discharge status: Home / Self Care | Payer: Medicaid Other | Source: Ambulatory Visit | Attending: Neurosurgery | Admitting: Neurosurgery

## 2017-08-30 DIAGNOSIS — I10 Essential (primary) hypertension: Secondary | ICD-10-CM | POA: Diagnosis not present

## 2017-08-30 DIAGNOSIS — Z01812 Encounter for preprocedural laboratory examination: Secondary | ICD-10-CM | POA: Insufficient documentation

## 2017-08-30 DIAGNOSIS — R001 Bradycardia, unspecified: Secondary | ICD-10-CM | POA: Insufficient documentation

## 2017-08-30 DIAGNOSIS — Z01810 Encounter for preprocedural cardiovascular examination: Secondary | ICD-10-CM | POA: Insufficient documentation

## 2017-08-30 HISTORY — DX: Anxiety disorder, unspecified: F41.9

## 2017-08-30 HISTORY — DX: Hyperlipidemia, unspecified: E78.5

## 2017-08-30 HISTORY — DX: Inflammatory liver disease, unspecified: K75.9

## 2017-08-30 LAB — URINALYSIS, COMPLETE (UACMP) WITH MICROSCOPIC
BACTERIA UA: NONE SEEN
Bilirubin Urine: NEGATIVE
Glucose, UA: NEGATIVE mg/dL
Hgb urine dipstick: NEGATIVE
Ketones, ur: NEGATIVE mg/dL
Leukocytes, UA: NEGATIVE
NITRITE: NEGATIVE
PH: 7 (ref 5.0–8.0)
Protein, ur: NEGATIVE mg/dL
SPECIFIC GRAVITY, URINE: 1.004 — AB (ref 1.005–1.030)
WBC, UA: NONE SEEN WBC/hpf (ref 0–5)

## 2017-08-30 LAB — TYPE AND SCREEN
ABO/RH(D): O POS
ANTIBODY SCREEN: NEGATIVE

## 2017-08-30 LAB — DIFFERENTIAL
Basophils Absolute: 0 10*3/uL (ref 0–0.1)
Basophils Relative: 0 %
Eosinophils Absolute: 0.1 10*3/uL (ref 0–0.7)
Eosinophils Relative: 2 %
LYMPHS PCT: 33 %
Lymphs Abs: 1.9 10*3/uL (ref 1.0–3.6)
Monocytes Absolute: 0.5 10*3/uL (ref 0.2–1.0)
Monocytes Relative: 9 %
NEUTROS PCT: 56 %
Neutro Abs: 3.2 10*3/uL (ref 1.4–6.5)

## 2017-08-30 LAB — CBC
HCT: 39.7 % — ABNORMAL LOW (ref 40.0–52.0)
HEMOGLOBIN: 13.4 g/dL (ref 13.0–18.0)
MCH: 30.7 pg (ref 26.0–34.0)
MCHC: 33.8 g/dL (ref 32.0–36.0)
MCV: 90.8 fL (ref 80.0–100.0)
Platelets: 235 10*3/uL (ref 150–440)
RBC: 4.37 MIL/uL — AB (ref 4.40–5.90)
RDW: 14.4 % (ref 11.5–14.5)
WBC: 5.7 10*3/uL (ref 3.8–10.6)

## 2017-08-30 LAB — BASIC METABOLIC PANEL
Anion gap: 8 (ref 5–15)
BUN: 8 mg/dL (ref 6–20)
CALCIUM: 9.9 mg/dL (ref 8.9–10.3)
CHLORIDE: 101 mmol/L (ref 101–111)
CO2: 30 mmol/L (ref 22–32)
CREATININE: 0.88 mg/dL (ref 0.61–1.24)
GFR calc non Af Amer: >60 mL/min (ref >60)
Glucose, Bld: 88 mg/dL (ref 65–99)
Potassium: 4.9 mmol/L (ref 3.5–5.1)
SODIUM: 139 mmol/L (ref 135–145)

## 2017-08-30 LAB — SURGICAL PCR SCREEN
MRSA, PCR: NEGATIVE
Staphylococcus aureus: NEGATIVE

## 2017-08-30 LAB — PROTIME-INR
INR: 0.98
PROTHROMBIN TIME: 12.9 s (ref 11.4–15.2)

## 2017-08-30 LAB — APTT: aPTT: 29 seconds (ref 24–36)

## 2017-08-30 NOTE — Patient Instructions (Signed)
Your procedure is scheduled on: SEPTEMBER12, 2018 [facility] )  Report to Same Day Surgery 2nd floor medical mall [facility] Entrance-take elevator on left to 2nd floor.  Check in with surgery information desk.) To find out your arrival time please call [phone] between 1PM - 3PM on September 10, 2017 (TUESDAY) Remember: Instructions that are not followed completely may result in serious medical risk, up to and including death, or upon the discretion of your surgeon and anesthesiologist your surgery may need to be rescheduled.    _x___ 1. Do not eat food after midnight the night before your procedure. You may drink clear liquids up to 2 hours before you are scheduled to arrive at the hospital for your procedure.  Do not drink clear liquids within 2 hours of your scheduled arrival to the hospital.  Clear liquids include  --Water or Apple juice without pulp  --Clear carbohydrate beverage such as ClearFast or Gatorade  --Black Coffee or Clear Tea (No milk, no creamers, do not add anything to                  the coffee or Tea Type 1 and type 2 diabetics should only drink water.  No gum chewing or hard candies.     __x__ 2. No Alcohol for 24 hours before or after surgery.   __x__3. No Smoking for 24 prior to surgery.   ____  4. Bring all medications with you on the day of surgery if instructed.    __x__ 5. Notify your doctor if there is any change in your medical condition     (cold, fever, infections).     Do not wear jewelry, make-up, hairpins, clips or nail polish.  Do not wear lotions, powders, or perfumes.   Do not shave 48 hours prior to surgery. Men may shave face and neck.  Do not bring valuables to the hospital.    [facility] is not responsible for any belongings or valuables.               Contacts, dentures or bridgework may not be worn into surgery.  Leave your suitcase in the car. After surgery it may be brought to your room.  For patients admitted to the  hospital, discharge time is determined by your treatment team                   Patients discharged the day of surgery will not be allowed to drive home.  You will need someone to drive you home and stay with you the night of your procedure.    Please read over the following fact sheets that you were given:   [facility] Preparing for Surgery and or MRSA Information  TAKE THE FOLLOWING MEDICATIONS WITH A SIP OF WATER THE MORNING OF SURGERY ::  1. CELEXA  2. DILANTIN  3.  4.  5.  6.  ____Fleets enema or Magnesium Citrate as directed.   _x___ Use CHG Soap or sage wipes as directed on instruction sheet   ____ Use inhalers on the day of surgery and bring to hospital day of surgery  ____ Stop Metformin and Janumet 2 days prior to surgery.    ____ Take 1/2 of usual insulin dose the night before surgery and none on the morning surgery.  _x___ Follow recommendations from Cardiologist, Pulmonologist or PCP regarding          stopping Aspirin, Coumadin, Plavix ,Eliquis, Effient, or Pradaxa, and Pletal. (Dr. Izora Ribas  office to contact patient regarding stopping  Aspirin )  X____Stop Anti-inflammatories such as Advil, Aleve, Ibuprofen, Motrin, Naproxen, Naprosyn, Goodies powders or aspirin products. OK to take Tylenol   _x___ Stop supplements until after surgery.  But may continue Vitamin D, Vitamin B, and multivitamin     .   ____ Bring C-Pap to the hospital.

## 2017-08-30 NOTE — Progress Notes (Signed)
Pharmacy Note  Cefazolin per pharmacy consult   Will order cefazolin 2 g IV x1 preop for surgical ppx.

## 2017-09-11 ENCOUNTER — Ambulatory Visit: Payer: Medicaid Other | Admitting: Certified Registered Nurse Anesthetist

## 2017-09-11 ENCOUNTER — Encounter
Admission: RE | Disposition: A | Discharge status: Home / Self Care | Payer: Self-pay | Source: Ambulatory Visit | Attending: Neurosurgery

## 2017-09-11 ENCOUNTER — Ambulatory Visit
Admission: RE | Admit: 2017-09-11 | Discharge: 2017-09-11 | Disposition: A | Discharge status: Home / Self Care | Payer: Medicaid Other | Source: Ambulatory Visit | Attending: Neurosurgery | Admitting: Neurosurgery

## 2017-09-11 ENCOUNTER — Emergency Department
Admission: EM | Admit: 2017-09-11 | Discharge: 2017-09-12 | Disposition: A | Discharge status: Home / Self Care | Payer: Medicaid Other | Attending: Emergency Medicine | Admitting: Emergency Medicine

## 2017-09-11 ENCOUNTER — Ambulatory Visit: Payer: Medicaid Other

## 2017-09-11 DIAGNOSIS — G40909 Epilepsy, unspecified, not intractable, without status epilepticus: Secondary | ICD-10-CM | POA: Insufficient documentation

## 2017-09-11 DIAGNOSIS — Z807 Family history of other malignant neoplasms of lymphoid, hematopoietic and related tissues: Secondary | ICD-10-CM | POA: Diagnosis not present

## 2017-09-11 DIAGNOSIS — I10 Essential (primary) hypertension: Secondary | ICD-10-CM | POA: Diagnosis not present

## 2017-09-11 DIAGNOSIS — Z79899 Other long term (current) drug therapy: Secondary | ICD-10-CM | POA: Insufficient documentation

## 2017-09-11 DIAGNOSIS — Z8249 Family history of ischemic heart disease and other diseases of the circulatory system: Secondary | ICD-10-CM | POA: Diagnosis not present

## 2017-09-11 DIAGNOSIS — Z419 Encounter for procedure for purposes other than remedying health state, unspecified: Secondary | ICD-10-CM

## 2017-09-11 DIAGNOSIS — Z882 Allergy status to sulfonamides status: Secondary | ICD-10-CM | POA: Diagnosis not present

## 2017-09-11 DIAGNOSIS — M2578 Osteophyte, vertebrae: Secondary | ICD-10-CM | POA: Diagnosis not present

## 2017-09-11 DIAGNOSIS — Z87891 Personal history of nicotine dependence: Secondary | ICD-10-CM | POA: Diagnosis not present

## 2017-09-11 DIAGNOSIS — Z981 Arthrodesis status: Secondary | ICD-10-CM | POA: Diagnosis not present

## 2017-09-11 DIAGNOSIS — I2510 Atherosclerotic heart disease of native coronary artery without angina pectoris: Secondary | ICD-10-CM | POA: Diagnosis not present

## 2017-09-11 DIAGNOSIS — Z791 Long term (current) use of non-steroidal anti-inflammatories (NSAID): Secondary | ICD-10-CM | POA: Insufficient documentation

## 2017-09-11 DIAGNOSIS — M4712 Other spondylosis with myelopathy, cervical region: Secondary | ICD-10-CM | POA: Diagnosis present

## 2017-09-11 DIAGNOSIS — M4802 Spinal stenosis, cervical region: Secondary | ICD-10-CM | POA: Diagnosis present

## 2017-09-11 DIAGNOSIS — M542 Cervicalgia: Secondary | ICD-10-CM | POA: Diagnosis present

## 2017-09-11 DIAGNOSIS — E785 Hyperlipidemia, unspecified: Secondary | ICD-10-CM | POA: Diagnosis not present

## 2017-09-11 DIAGNOSIS — Z9889 Other specified postprocedural states: Secondary | ICD-10-CM | POA: Diagnosis not present

## 2017-09-11 DIAGNOSIS — Z7982 Long term (current) use of aspirin: Secondary | ICD-10-CM | POA: Diagnosis not present

## 2017-09-11 DIAGNOSIS — G8918 Other acute postprocedural pain: Secondary | ICD-10-CM | POA: Diagnosis not present

## 2017-09-11 DIAGNOSIS — Z888 Allergy status to other drugs, medicaments and biological substances status: Secondary | ICD-10-CM | POA: Diagnosis not present

## 2017-09-11 HISTORY — PX: CERVICAL DISC ARTHROPLASTY: SHX587

## 2017-09-11 LAB — URINE DRUG SCREEN, QUALITATIVE (ARMC ONLY)
AMPHETAMINES, UR SCREEN: NOT DETECTED
Barbiturates, Ur Screen: NOT DETECTED
Benzodiazepine, Ur Scrn: NOT DETECTED
CANNABINOID 50 NG, UR ~~LOC~~: POSITIVE — AB
Cocaine Metabolite,Ur ~~LOC~~: NOT DETECTED
MDMA (ECSTASY) UR SCREEN: NOT DETECTED
METHADONE SCREEN, URINE: NOT DETECTED
Opiate, Ur Screen: NOT DETECTED
Phencyclidine (PCP) Ur S: NOT DETECTED
TRICYCLIC, UR SCREEN: POSITIVE — AB

## 2017-09-11 LAB — ABO/RH: ABO/RH(D): O POS

## 2017-09-11 SURGERY — CERVICAL ANTERIOR DISC ARTHROPLASTY
Anesthesia: General | Site: Spine Cervical | Wound class: Clean

## 2017-09-11 MED ORDER — PROPOFOL 10 MG/ML IV BOLUS
INTRAVENOUS | Status: AC
  Filled 2017-09-11: qty 20

## 2017-09-11 MED ORDER — PROPOFOL 500 MG/50ML IV EMUL
INTRAVENOUS | Status: AC
  Filled 2017-09-11: qty 50

## 2017-09-11 MED ORDER — LORAZEPAM 2 MG/ML IJ SOLN
1.0000 mg | Freq: Once | INTRAMUSCULAR | Status: AC
  Administered 2017-09-11: 1 mg via INTRAVENOUS
  Filled 2017-09-11: qty 1

## 2017-09-11 MED ORDER — GELATIN ABSORBABLE 12-7 MM EX MISC
CUTANEOUS | Status: DC | PRN
  Administered 2017-09-11: 1

## 2017-09-11 MED ORDER — FENTANYL CITRATE (PF) 100 MCG/2ML IJ SOLN
INTRAMUSCULAR | Status: AC
  Administered 2017-09-11: 25 ug via INTRAVENOUS
  Filled 2017-09-11: qty 2

## 2017-09-11 MED ORDER — ONDANSETRON HCL 4 MG/2ML IJ SOLN
INTRAMUSCULAR | Status: DC | PRN
  Administered 2017-09-11: 4 mg via INTRAVENOUS

## 2017-09-11 MED ORDER — FENTANYL CITRATE (PF) 100 MCG/2ML IJ SOLN
INTRAMUSCULAR | Status: AC
  Filled 2017-09-11: qty 2

## 2017-09-11 MED ORDER — HYDROMORPHONE HCL 1 MG/ML IJ SOLN
0.2500 mg | INTRAMUSCULAR | Status: AC | PRN
  Administered 2017-09-11 (×4): 0.25 mg via INTRAVENOUS

## 2017-09-11 MED ORDER — HYDROMORPHONE HCL 1 MG/ML IJ SOLN
INTRAMUSCULAR | Status: AC
  Filled 2017-09-11: qty 1

## 2017-09-11 MED ORDER — HYDRALAZINE HCL 20 MG/ML IJ SOLN
INTRAMUSCULAR | Status: AC
  Administered 2017-09-11: 10 mg via INTRAVENOUS
  Filled 2017-09-11: qty 1

## 2017-09-11 MED ORDER — ONDANSETRON HCL 4 MG/2ML IJ SOLN
4.0000 mg | Freq: Once | INTRAMUSCULAR | Status: AC
  Administered 2017-09-11: 4 mg via INTRAVENOUS

## 2017-09-11 MED ORDER — CEFAZOLIN SODIUM-DEXTROSE 2-4 GM/100ML-% IV SOLN
INTRAVENOUS | Status: AC
  Filled 2017-09-11: qty 100

## 2017-09-11 MED ORDER — REMIFENTANIL HCL 1 MG IV SOLR
INTRAVENOUS | Status: AC
  Filled 2017-09-11: qty 1000

## 2017-09-11 MED ORDER — HYDRALAZINE HCL 20 MG/ML IJ SOLN
10.0000 mg | Freq: Once | INTRAMUSCULAR | Status: AC
  Administered 2017-09-11: 10 mg via INTRAVENOUS

## 2017-09-11 MED ORDER — PHENYLEPHRINE HCL 10 MG/ML IJ SOLN
INTRAMUSCULAR | Status: AC
  Filled 2017-09-11: qty 1

## 2017-09-11 MED ORDER — SODIUM CHLORIDE 0.9 % IR SOLN
Status: DC | PRN
  Administered 2017-09-11: 1000 mL

## 2017-09-11 MED ORDER — OXYCODONE HCL 5 MG PO TABS: 5.0000 mg | ORAL_TABLET | ORAL | 0 refills | Status: DC | PRN

## 2017-09-11 MED ORDER — HYDROMORPHONE HCL 1 MG/ML IJ SOLN
0.5000 mg | INTRAMUSCULAR | Status: AC | PRN
  Administered 2017-09-11 (×2): 0.5 mg via INTRAVENOUS

## 2017-09-11 MED ORDER — GLYCOPYRROLATE 0.2 MG/ML IJ SOLN
INTRAMUSCULAR | Status: DC | PRN
  Administered 2017-09-11: 0.2 mg via INTRAVENOUS

## 2017-09-11 MED ORDER — ONDANSETRON HCL 4 MG/2ML IJ SOLN
4.0000 mg | Freq: Once | INTRAMUSCULAR | Status: AC | PRN
  Administered 2017-09-11: 4 mg via INTRAVENOUS

## 2017-09-11 MED ORDER — LABETALOL HCL 5 MG/ML IV SOLN
INTRAVENOUS | Status: DC | PRN
  Administered 2017-09-11: 5 mg via INTRAVENOUS

## 2017-09-11 MED ORDER — LISINOPRIL 10 MG PO TABS
10.0000 mg | ORAL_TABLET | Freq: Once | ORAL | Status: AC
  Administered 2017-09-11: 10 mg via ORAL
  Filled 2017-09-11: qty 1

## 2017-09-11 MED ORDER — MIDAZOLAM HCL 2 MG/2ML IJ SOLN
INTRAMUSCULAR | Status: AC
  Filled 2017-09-11: qty 2

## 2017-09-11 MED ORDER — HYDROMORPHONE HCL 1 MG/ML IJ SOLN
INTRAMUSCULAR | Status: AC
  Administered 2017-09-11: 0.25 mg via INTRAVENOUS
  Filled 2017-09-11: qty 1

## 2017-09-11 MED ORDER — OXYCODONE HCL 5 MG PO TABS
5.0000 mg | ORAL_TABLET | ORAL | Status: DC | PRN
  Administered 2017-09-11: 5 mg via ORAL
  Filled 2017-09-11: qty 1

## 2017-09-11 MED ORDER — ONDANSETRON HCL 4 MG/2ML IJ SOLN
INTRAMUSCULAR | Status: AC
  Filled 2017-09-11: qty 2

## 2017-09-11 MED ORDER — THROMBIN 5000 UNITS EX SOLR
CUTANEOUS | Status: DC | PRN
  Administered 2017-09-11: 5000 [IU] via TOPICAL

## 2017-09-11 MED ORDER — LACTATED RINGERS IV SOLN
INTRAVENOUS | Status: DC
  Administered 2017-09-11: 09:00:00 via INTRAVENOUS

## 2017-09-11 MED ORDER — LABETALOL HCL 5 MG/ML IV SOLN
INTRAVENOUS | Status: AC
  Filled 2017-09-11: qty 4

## 2017-09-11 MED ORDER — HYDROMORPHONE HCL 1 MG/ML IJ SOLN
0.5000 mg | Freq: Once | INTRAMUSCULAR | Status: AC
  Administered 2017-09-11: 0.5 mg via INTRAVENOUS

## 2017-09-11 MED ORDER — PROPOFOL 500 MG/50ML IV EMUL
INTRAVENOUS | Status: DC | PRN
  Administered 2017-09-11: 100 ug/kg/min via INTRAVENOUS

## 2017-09-11 MED ORDER — CEFAZOLIN SODIUM-DEXTROSE 2-4 GM/100ML-% IV SOLN
2.0000 g | Freq: Once | INTRAVENOUS | Status: AC
  Administered 2017-09-11: 2 g via INTRAVENOUS

## 2017-09-11 MED ORDER — SUCCINYLCHOLINE CHLORIDE 20 MG/ML IJ SOLN
INTRAMUSCULAR | Status: AC
  Filled 2017-09-11: qty 1

## 2017-09-11 MED ORDER — FAMOTIDINE 20 MG PO TABS
ORAL_TABLET | ORAL | Status: AC
  Administered 2017-09-11: 20 mg via ORAL
  Filled 2017-09-11: qty 1

## 2017-09-11 MED ORDER — LIDOCAINE HCL (PF) 2 % IJ SOLN
INTRAMUSCULAR | Status: AC
  Filled 2017-09-11: qty 2

## 2017-09-11 MED ORDER — METHOCARBAMOL 500 MG PO TABS
500.0000 mg | ORAL_TABLET | Freq: Four times a day (QID) | ORAL | 0 refills | Status: DC | PRN
Start: 2017-09-11 — End: 2019-04-06

## 2017-09-11 MED ORDER — SEVOFLURANE IN SOLN
RESPIRATORY_TRACT | Status: AC
  Filled 2017-09-11: qty 250

## 2017-09-11 MED ORDER — ACETAMINOPHEN 10 MG/ML IV SOLN
INTRAVENOUS | Status: DC | PRN
  Administered 2017-09-11: 1000 mg via INTRAVENOUS

## 2017-09-11 MED ORDER — FENTANYL CITRATE (PF) 100 MCG/2ML IJ SOLN
25.0000 ug | INTRAMUSCULAR | Status: AC | PRN
  Administered 2017-09-11 (×6): 25 ug via INTRAVENOUS

## 2017-09-11 MED ORDER — REMIFENTANIL HCL 1 MG IV SOLR
INTRAVENOUS | Status: DC | PRN
  Administered 2017-09-11: .1 ug/kg/min via INTRAVENOUS

## 2017-09-11 MED ORDER — PROPOFOL 10 MG/ML IV BOLUS
INTRAVENOUS | Status: DC | PRN
  Administered 2017-09-11: 100 mg via INTRAVENOUS
  Administered 2017-09-11: 40 mg via INTRAVENOUS

## 2017-09-11 MED ORDER — EPHEDRINE SULFATE 50 MG/ML IJ SOLN
INTRAMUSCULAR | Status: AC
  Filled 2017-09-11: qty 1

## 2017-09-11 MED ORDER — HYDROMORPHONE HCL 1 MG/ML IJ SOLN
INTRAMUSCULAR | Status: AC
  Administered 2017-09-11: 0.5 mg via INTRAVENOUS
  Filled 2017-09-11: qty 1

## 2017-09-11 MED ORDER — LIDOCAINE HCL (CARDIAC) 20 MG/ML IV SOLN
INTRAVENOUS | Status: DC | PRN
  Administered 2017-09-11: 80 mg via INTRAVENOUS

## 2017-09-11 MED ORDER — DEXAMETHASONE SODIUM PHOSPHATE 10 MG/ML IJ SOLN
INTRAMUSCULAR | Status: DC | PRN
  Administered 2017-09-11: 10 mg via INTRAVENOUS

## 2017-09-11 MED ORDER — FENTANYL CITRATE (PF) 100 MCG/2ML IJ SOLN
INTRAMUSCULAR | Status: DC | PRN
  Administered 2017-09-11: 25 ug via INTRAVENOUS
  Administered 2017-09-11: 50 ug via INTRAVENOUS

## 2017-09-11 MED ORDER — SUCCINYLCHOLINE CHLORIDE 20 MG/ML IJ SOLN
INTRAMUSCULAR | Status: DC | PRN
  Administered 2017-09-11: 100 mg via INTRAVENOUS

## 2017-09-11 MED ORDER — BUPIVACAINE-EPINEPHRINE (PF) 0.5% -1:200000 IJ SOLN
INTRAMUSCULAR | Status: DC | PRN
  Administered 2017-09-11: 5 mL

## 2017-09-11 MED ORDER — DEXAMETHASONE SODIUM PHOSPHATE 10 MG/ML IJ SOLN
INTRAMUSCULAR | Status: AC
  Filled 2017-09-11: qty 1

## 2017-09-11 MED ORDER — MIDAZOLAM HCL 2 MG/2ML IJ SOLN
INTRAMUSCULAR | Status: DC | PRN
  Administered 2017-09-11: 1 mg via INTRAVENOUS

## 2017-09-11 MED ORDER — FAMOTIDINE 20 MG PO TABS
20.0000 mg | ORAL_TABLET | Freq: Once | ORAL | Status: AC
  Administered 2017-09-11: 20 mg via ORAL

## 2017-09-11 SURGICAL SUPPLY — 59 items
BLADE BOVIE TIP EXT 4 (BLADE) ×3 IMPLANT
BLADE SURG 15 STRL LF DISP TIS (BLADE) ×1 IMPLANT
BLADE SURG 15 STRL SS (BLADE) ×2
BUR NEURO DRILL SOFT 3.0X3.8M (BURR) ×3 IMPLANT
CANISTER SUCT 1200ML W/VALVE (MISCELLANEOUS) ×6 IMPLANT
CHLORAPREP W/TINT 26ML (MISCELLANEOUS) ×3 IMPLANT
CLOSURE WOUND 1/2 X4 (GAUZE/BANDAGES/DRESSINGS)
COUNTER NEEDLE 20/40 LG (NEEDLE) ×3 IMPLANT
COVER LIGHT HANDLE STERIS (MISCELLANEOUS) ×6 IMPLANT
CRADLE LAMINECT ARM (MISCELLANEOUS) ×3 IMPLANT
CUP MEDICINE 2OZ PLAST GRAD ST (MISCELLANEOUS) ×6 IMPLANT
DERMABOND ADVANCED (GAUZE/BANDAGES/DRESSINGS) ×2
DERMABOND ADVANCED .7 DNX12 (GAUZE/BANDAGES/DRESSINGS) ×1 IMPLANT
DISC MOBI-C CERVICAL 15X17 H5 (Miscellaneous) ×3 IMPLANT
DRAPE C-ARM 42X72 X-RAY (DRAPES) ×6 IMPLANT
DRAPE INCISE IOBAN 66X45 STRL (DRAPES) ×3 IMPLANT
DRAPE LAPAROTOMY 77X122 PED (DRAPES) ×3 IMPLANT
DRAPE MICROSCOPE SPINE 48X150 (DRAPES) ×3 IMPLANT
DRAPE POUCH INSTRU U-SHP 10X18 (DRAPES) ×3 IMPLANT
DRAPE SHEET LG 3/4 BI-LAMINATE (DRAPES) ×3 IMPLANT
DRAPE SURG 17X11 SM STRL (DRAPES) ×12 IMPLANT
DRAPE TABLE BACK 80X90 (DRAPES) ×3 IMPLANT
ELECT CAUTERY BLADE TIP 2.5 (TIP) ×3
ELECT REM PT RETURN 9FT ADLT (ELECTROSURGICAL) ×3
ELECTRODE CAUTERY BLDE TIP 2.5 (TIP) ×1 IMPLANT
ELECTRODE REM PT RTRN 9FT ADLT (ELECTROSURGICAL) ×1 IMPLANT
FEE INTRAOP MONITOR IMPULS NCS (MISCELLANEOUS) ×1 IMPLANT
FRAME EYE SHIELD (PROTECTIVE WEAR) ×3 IMPLANT
GLOVE SURG SYN 8.5  E (GLOVE) ×6
GLOVE SURG SYN 8.5 E (GLOVE) ×3 IMPLANT
GOWN SRG XL LVL 3 NONREINFORCE (GOWNS) ×1 IMPLANT
GOWN STRL NON-REIN TWL XL LVL3 (GOWNS) ×2
GOWN STRL REUS W/ TWL LRG LVL3 (GOWN DISPOSABLE) ×2 IMPLANT
GOWN STRL REUS W/TWL LRG LVL3 (GOWN DISPOSABLE) ×4
GRADUATE 1200CC STRL 31836 (MISCELLANEOUS) ×3 IMPLANT
INTRAOP MONITOR FEE IMPULS NCS (MISCELLANEOUS) ×1
INTRAOP MONITOR FEE IMPULSE (MISCELLANEOUS) ×2
IV CATH ANGIO 12GX3 LT BLUE (NEEDLE) ×3 IMPLANT
KIT RM TURNOVER STRD PROC AR (KITS) ×3 IMPLANT
MARKER SKIN DUAL TIP RULER LAB (MISCELLANEOUS) ×3 IMPLANT
NEEDLE HYPO 22GX1.5 SAFETY (NEEDLE) ×3 IMPLANT
NS IRRIG 1000ML POUR BTL (IV SOLUTION) ×3 IMPLANT
PACK LAMINECTOMY NEURO (CUSTOM PROCEDURE TRAY) ×3 IMPLANT
PIN CASPAR 14 (PIN) IMPLANT
PIN CASPAR 14MM (PIN)
PIN CASPAR SPINAL 14MM CERVICA (PIN) ×3 IMPLANT
SPOGE SURGIFLO 8M (HEMOSTASIS) ×4
SPONGE KITTNER 5P (MISCELLANEOUS) ×3 IMPLANT
SPONGE SURGIFLO 8M (HEMOSTASIS) ×2 IMPLANT
STAPLER SKIN PROX 35W (STAPLE) ×3 IMPLANT
STRIP CLOSURE SKIN 1/2X4 (GAUZE/BANDAGES/DRESSINGS) IMPLANT
SUT V-LOC 90 ABS DVC 3-0 CL (SUTURE) ×3 IMPLANT
SUT VIC AB 3-0 SH 8-18 (SUTURE) ×3 IMPLANT
SYR 30ML LL (SYRINGE) ×3 IMPLANT
TAPE ADH 3 LX (MISCELLANEOUS) ×3 IMPLANT
TOWEL OR 17X26 4PK STRL BLUE (TOWEL DISPOSABLE) ×12 IMPLANT
TRAY FOLEY W/METER SILVER 16FR (SET/KITS/TRAYS/PACK) IMPLANT
TUBING CONNECTING 10 (TUBING) ×2 IMPLANT
TUBING CONNECTING 10' (TUBING) ×1

## 2017-09-11 NOTE — Anesthesia Procedure Notes (Signed)
Procedure Name: Intubation Date/Time: 09/11/2017 10:14 AM Performed by: Johnna Acosta Pre-anesthesia Checklist: Patient identified, Emergency Drugs available, Suction available, Patient being monitored and Timeout performed Patient Re-evaluated:Patient Re-evaluated prior to induction Oxygen Delivery Method: Circle system utilized Preoxygenation: Pre-oxygenation with 100% oxygen Induction Type: IV induction Ventilation: Mask ventilation with difficulty and Oral airway inserted - appropriate to patient size Laryngoscope Size: Miller and 2 Grade View: Grade I Tube type: Oral Tube size: 7.5 mm Number of attempts: 1 Airway Equipment and Method: LTA kit utilized and Stylet Placement Confirmation: ETT inserted through vocal cords under direct vision,  positive ETCO2 and breath sounds checked- equal and bilateral Secured at: 22 cm Tube secured with: Tape Dental Injury: Teeth and Oropharynx as per pre-operative assessment  Difficulty Due To: Difficult Airway- due to dentition and Difficult Airway- due to limited oral opening Future Recommendations: Recommend- induction with short-acting agent, and alternative techniques readily available

## 2017-09-11 NOTE — Anesthesia Postprocedure Evaluation (Signed)
Anesthesia Post Note  Patient: Kenneth Daniels  Procedure(s) Performed: Procedure(s) (LRB): CERVICAL ANTERIOR DISC ARTHROPLASTY C3-4 POSSIBLE C4-5 (N/A)  Patient location during evaluation: PACU Anesthesia Type: General Level of consciousness: awake and alert and oriented Pain management: pain level controlled Vital Signs Assessment: post-procedure vital signs reviewed and stable Respiratory status: spontaneous breathing Cardiovascular status: blood pressure returned to baseline Anesthetic complications: no     Last Vitals:  Vitals:   09/11/17 1500 09/11/17 1526  BP: (!) 162/91 (!) 176/89  Pulse: 74 78  Resp: 14 16  Temp:    SpO2: 99% 100%    Last Pain:  Vitals:   09/11/17 1526  TempSrc:   PainSc: 2                  Phillippa Straub

## 2017-09-11 NOTE — ED Provider Notes (Signed)
[facility] Emergency Department Provider Note   ____________________________________________   First MD Initiated Contact with Patient 09/11/17 2328     (approximate)  I have reviewed the triage vital signs and the nursing notes.   HISTORY  Chief Complaint Neck Pain    HPI Kenneth Daniels is a 61 y.o. male who presents to the ED from home with a chief complaint of postoperative neck pain. Patient had anterior cervical disc arthroplasty C3-C4 this afternoon. He was discharged on oxycodone and Robaxin but did not take them because he was afraid they would interact with his other medications. Presents with postoperative neck pain. Did not take his lisinopril this morning because he was NPO for surgery.Denies fever, chills, chest pain, shortness of breath, abdominal pain, nausea, vomiting. Denies recent travel or trauma. Nothing makes his symptoms better or worse.   Past Medical History:  Diagnosis Date  . 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 Anxiety   . Atypical chest pain 04/17/2016  . CAD in native artery 04/17/2016  . Cerebral infarction ([place]) 11/25/2013   Overview:  IMO Problem List Replacer Jan. 2016   . Cervical spinal stenosis 07/06/2015  . Essential (primary) hypertension 04/15/2008  . 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 Headache, migraine 11/25/2013  . Heart murmur   . Hepatitis    Hep "C"  . HLD (hyperlipidemia) 03/27/2012  . Hyperlipidemia   . Idiopathic localized osteoarthropathy 02/26/2011  . Major depressive disorder with single episode 11/14/2009  . Myocardial infarction ([place]) 2009  . 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 Seizures ([place])    Last Seizure 2013  . Stroke [facility])     Patient Active Problem List   Diagnosis Date Noted  . Vitamin D deficiency 06/06/2016  . Atypical chest pain 04/17/2016  . CAD in native artery 04/17/2016  . Encounter for preprocedural cardiovascular examination 04/17/2016  . Neuropathic ulnar nerve  09/29/2015  . Cervical spinal stenosis 07/06/2015  . 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 Headache, primary stabbing 10/06/2014  . Cerebral infarction ([place]) 11/25/2013  . Migraines 11/25/2013  . 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 Hyperlipidemia 03/27/2012  . Idiopathic localized osteoarthropathy 02/26/2011  . Major depressive disorder, single episode 11/14/2009  . Drug abuse, marijuana 11/14/2009  . Essential hypertension 04/15/2008  . 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 ABDOMINAL EXPLORATION SURGERY  2010?  Marland Kitchen BACK SURGERY    . COLON SURGERY     Colon Resection  . HERNIA REPAIR    . HOLMIUM LASER APPLICATION N/A 42/35/3614   Procedure: HOLMIUM LASER APPLICATION;  Surgeon: Hollice Espy, MD;  Location: [facility] ORS;  Service: Urology;  Laterality: N/A;  . PROSTATE ABLATION N/A 11/20/2016   Procedure: PROSTATE ABLATION;  Surgeon: Hollice Espy, MD;  Location: [facility] ORS;  Service: Urology;  Laterality: N/A;  . SOFT TISSUE TUMOR RESECTION     left thigh  . TRANSURETHRAL RESECTION OF PROSTATE N/A 11/20/2016   Procedure: TRANSURETHRAL RESECTION OF THE PROSTATE (TURP);  Surgeon: Hollice Espy, MD;  Location: [facility] ORS;  Service: Urology;  Laterality: N/A;    Prior to Admission medications   Medication Sig Start Date End Date Taking? Authorizing Provider  cholecalciferol (VITAMIN D) 1000 units tablet Take 1,000 Units by mouth daily.    [provider]  Cholecalciferol (VITAMIN D-3) 5000 units TABS Take 5,000 Units by mouth daily.    [provider]  citalopram (CELEXA) 40 MG tablet TAKE 1 TABLET  DAILY BY MOUTH 02/23/16   [provider]  lisinopril (PRINIVIL,ZESTRIL) 10 MG tablet TAKE 1 TABLET BY MOUTH DAILY FOR HIGH BP 01/23/16   [provider]  methocarbamol (ROBAXIN) 500 MG tablet Take 1 tablet (500 mg total) by mouth every 6 (six) hours as needed for muscle spasms. 09/11/17    Marin Olp, PA-C  oxyCODONE (ROXICODONE) 5 MG immediate release tablet Take 1 tablet (5 mg total) by mouth every 4 (four) hours as needed for breakthrough pain. 09/11/17   Marin Olp, PA-C  phenytoin (DILANTIN) 100 MG ER capsule TAKE 3 CAPSULES IN THE MORNING AND 2 CAPSULES EVERY EVENING 02/02/16   [provider]  simvastatin (ZOCOR) 20 MG tablet TAKE 1 TABLET EVERY EVENING FOR HIGH CHOLESTEROL 01/29/16   [provider]    Allergies Gabapentin and Sulfa antibiotics  Family History  Problem Relation Age of Onset  . Bladder Cancer Neg Hx   . Prostate cancer Neg Hx   . Kidney cancer Neg Hx     Social History Social History  Substance Use Topics  . Smoking status: Former Smoker    Types: Cigarettes    Quit date: 08/01/2007  . Smokeless tobacco: Never Used  . Alcohol use No    Review of Systems  Constitutional: No fever/chills. Eyes: No visual changes. ENT: No sore throat. Cardiovascular: Denies chest pain. Respiratory: Denies shortness of breath. Gastrointestinal: No abdominal pain.  No nausea, no vomiting.  No diarrhea.  No constipation. Genitourinary: Negative for dysuria. Musculoskeletal: positive for neck pain. Negative for back pain. Skin: Negative for rash. Neurological: Negative for headaches, focal weakness or numbness.   ____________________________________________   PHYSICAL EXAM:  VITAL SIGNS: ED Triage Vitals  Enc Vitals Group     BP 09/11/17 2252 (!) 156/111     Pulse Rate 09/11/17 2252 84     Resp 09/11/17 2252 18     Temp 09/11/17 2252 98 F (36.7 C)     Temp Source 09/11/17 2252 Oral     SpO2 09/11/17 2252 99 %     Weight 09/11/17 2253 140 lb (63.5 kg)     Height 09/11/17 2253 5\' 10"  (1.778 m)     Head Circumference --      Peak Flow --      Pain Score 09/11/17 2251 10     Pain Loc --      Pain Edu? --      Excl. in [place]? --     Constitutional: Alert and oriented. Well appearing and in no acute distress. Eyes:  Conjunctivae are normal. PERRL. EOMI. Head: Atraumatic. Nose: No congestion/rhinnorhea. Mouth/Throat: Mucous membranes are moist.  Oropharynx non-erythematous. Neck: No stridor.  Anterior approach postoperative site clean/dry/intact.  No swelling or neck masses. Cardiovascular: Normal rate, regular rhythm. Grossly normal heart sounds.  Good peripheral circulation. Respiratory: Normal respiratory effort.  No retractions. Lungs CTAB. Gastrointestinal: Soft and nontender. No distention. No abdominal bruits. No CVA tenderness. Musculoskeletal: No lower extremity tenderness nor edema.  No joint effusions. Neurologic:  Normal speech and language. No gross focal neurologic deficits are appreciated. No gait instability. Skin:  Skin is warm, dry and intact. No rash noted. Psychiatric: Mood and affect are normal. Speech and behavior are normal.  ____________________________________________   LABS (all labs ordered are listed, but only abnormal results are displayed)  Labs Reviewed - No data to display ____________________________________________  EKG  none ____________________________________________  RADIOLOGY  Dg Cervical Spine 2-3 Views  Result Date: 09/11/2017 CLINICAL DATA:  Surgery, elective EXAM: CERVICAL SPINE - 2-3 VIEW COMPARISON:  MRI of cervical spine 05/30/2017 FINDINGS: Four intraoperative views of the cervical spine are submitted. The first image demonstrates localization of the C3-4 level. Metallic disc replacement displaced at the C3-4 level with restoration of disc height. Patient is intubated.  Surgical sponge is in the field. IMPRESSION: Cervical disc replacement at C3-4 with restoration of disc height no radiographic evidence for complication. Electronically Signed   By: San Morelle M.D.   On: 09/11/2017 12:45    ____________________________________________   PROCEDURES  Procedure(s) performed: None  Procedures  Critical Care performed:  No  ____________________________________________   INITIAL IMPRESSION / ASSESSMENT AND PLAN / ED COURSE  Pertinent labs & imaging results that were available during my care of the patient were reviewed by me and considered in my medical decision making (see chart for details).  61 year old male who presents with postoperative neck pain who did not take any of his prescribed pain medicines or muscle relaxers because he was afraid they would interact with his other prescribed medications. Also did not take his antihypertensive today in preparation for surgery. Will administer analgesia, muscle relaxer, antihypertensive and reassess.  Clinical Course as of Sep 12 229  Thu Sep 12, 2017  0102 Awakened patient from sleep to assess his pain. States overall neck pain is better. Complains of headache which she has chronically. Blood pressures improved. Will add low-dose Toradol  [JS]  0225 Awaken patient from sleep to assess his pain. States headache is much better and he is "ready to go". No focal neurological deficits on reexamination. Patient has already filled his prescriptions for pain and muscle relaxers which were prescribed by his neurosurgeon. I strongly encouraged patient to take his medicines to control pain. strict return precautions given. Patient verbalizes understanding and agrees with plan of care.  [JS]    Clinical Course User Index [JS] Paulette Blanch, MD     ____________________________________________   FINAL CLINICAL IMPRESSION(S) / ED DIAGNOSES  Final diagnoses:  Neck pain  Post-operative pain      NEW MEDICATIONS STARTED DURING THIS VISIT:  New Prescriptions   No medications on file     Note:  This document was prepared using Dragon voice recognition software and may include unintentional dictation errors.    Paulette Blanch, MD 09/12/17 [phone]

## 2017-09-11 NOTE — Progress Notes (Signed)
Offered pt pain pill and he refuses at present

## 2017-09-11 NOTE — Discharge Instructions (Signed)
Your surgeon has performed an operation on your cervical spine (neck) to relieve pressure on the spinal cord and/or nerves. This involved making an incision in the front of your neck and removing one or more of the discs that support your spine. Next, a small piece of bone, a titanium plate, and screws were used to fuse two or more of the vertebrae (bones) together.  The following are instructions to help in your recovery once you have been discharged from the hospital. Even if you feel well, it is important that you follow these activity guidelines. If you do not let your neck heal properly from the surgery, you can increase the chance of return of your symptoms and other complications.   Activity    No bending, lifting, or twisting (BLT). Avoid lifting objects heavier than 10 pounds (gallon milk jug).  Where possible, avoid household activities that involve lifting, bending, reaching, pushing, or pulling such as laundry, vacuuming, grocery shopping, and childcare. Try to arrange for help from friends and family for these activities while your back heals.  Increase physical activity slowly as tolerated.  Taking short walks is encouraged, but avoid strenuous exercise. Do not jog, run, bicycle, lift weights, or participate in any other exercises unless specifically allowed by your doctor.  Talk to your doctor before resuming sexual activity.  You should not drive until cleared by your doctor.  Until released by your doctor, you should not return to work or school.  You should rest at home and let your body heal.   You may shower three days after your surgery.  After showering, lightly dab your incision dry. Do not take a tub bath or go swimming until approved by your doctor at your follow-up appointment.  If your doctor ordered a cervical collar (neck brace) for you, you should wear it whenever you are out of bed. You may remove it when lying down or sleeping, but you should wear it at all  other times. Not all neck surgeries require a cervical collar.  If you smoke, we strongly recommend that you quit.  Smoking has been proven to interfere with normal bone healing and will dramatically reduce the success rate of your surgery. Please contact [facility] (800-QUIT-NOW) and use the resources at www.[facility].com for assistance in stopping smoking.  Surgical Incision   If you have staples or stitches on your incision, you should have a follow up scheduled for removal. If you do not have staples or stitches, you will have steri-strips (small pieces of surgical tape) or Dermabond glue. The steri-strips/glue should begin to peel away within about a week (it is fine if the steri-strips fall off before then). If the strips are still in place one week after your surgery, you may gently remove them.  Diet           You may return to your usual diet. However, you may experience discomfort when swallowing in the first month after your surgery. This is normal. You may find that softer foods are more comfortable for you to swallow. Be sure to stay hydrated.  When to Contact us  You may experience pain in your neck and/or pain between your shoulder blades. This is normal and should improve in the next few weeks with the help of pain medication, muscle relaxers, and rest. Some patients report that a warm compress on the back of the neck or between the shoulder blades helps.  However, should you experience any of the following, contact us immediately:  New numbness or weakness  Pain that is progressively getting worse, and is not relieved by your pain medication, muscle relaxers, rest, and warm compresses  Bleeding, redness, swelling, pain, or drainage from surgical incision  Chills or flu-like symptoms  Fever greater than 101.0 F (38.3 C)  Inability to eat, drink fluids, or take medications  Problems with bowel or bladder functions  Difficulty breathing or shortness of breath  Warmth,  tenderness, or swelling in your calf Contact Information  During office hours (Monday-Friday 9 am to 5 pm), please call your physician at [phone] and ask for Berdine Addison  After hours and weekends, please call the [facility] Operator at [phone] and ask for the Neurosurgery Resident On Call   For a life-threatening emergency, call 911

## 2017-09-11 NOTE — Anesthesia Post-op Follow-up Note (Signed)
Anesthesia QCDR form completed.        

## 2017-09-11 NOTE — Progress Notes (Addendum)
Pharmacy consult to dose Cefazolin for surgical prophylaxis today.  MD already put in Cefazolin 2gm ONCE order.  Will let this dose stand as nurse has already administered this.  Charlane Ferretti, RPh

## 2017-09-11 NOTE — ED Triage Notes (Addendum)
Pt arrives to ED from home via [facility] with c/o neck pain s/p SDS earlier today. EMS reports that pt was r/x'd pain medication but didn't take it because he was unsure "if would react with his other prescribed medications". Pt is A&O, in moderate distress r/t pain; RR even, regular, and unlabored; skin color/temp is WNL.

## 2017-09-11 NOTE — Progress Notes (Signed)
  History: Kenneth Daniels is in PACU after C3-4 arthroplasty and osteophyte removal. Pt tolerated procedure well. Complaining of severe, intermittent left posterior/lateral knifelike pain. Hand numbness that he was feeling prior to surgery is improving.   Physical Exam: Vitals:   09/11/17 1245 09/11/17 1300  BP: (!) 164/91 (!) 156/98  Pulse: 68 62  Resp: 17 16  Temp: 97.6 F (36.4 C)   SpO2: 100% 100%    AA Ox3 CNI  Strength:5/5 throughout, sensation intact BLE, BUE   Assessment/Plan:  Kenneth Daniels is in recovery after C3-4 arthroplasty. Patient tolerated procedure well. Surgery completed without complications. Provided oxycodone and robaxin for pain management. Advised patient that they can continue NSAID for inflammation. Patient scheduled for follow up in the clinic in two weeks. Advised to contact office if questions arise before then.   Marin Olp PA-C Department of Neurosurgery

## 2017-09-11 NOTE — H&P (Signed)
I have reviewed and confirmed my history and physical from 08/22/17 with no additions or changes. Plan for arthroplasty at C3-4.  Risks and benefits reviewed.    Heart sounds normal no MRG. Chest Clear to Auscultation Bilaterally.

## 2017-09-11 NOTE — Transfer of Care (Signed)
Immediate Anesthesia Transfer of Care Note  Patient: Kenneth Daniels  Procedure(s) Performed: Procedure(s): CERVICAL ANTERIOR DISC ARTHROPLASTY C3-4 POSSIBLE C4-5 (N/A)  Patient Location: PACU  Anesthesia Type:General  Level of Consciousness: sedated  Airway & Oxygen Therapy: Patient Spontanous Breathing and Patient connected to face mask oxygen  Post-op Assessment: Report given to RN and Post -op Vital signs reviewed and stable  Post vital signs: Reviewed and stable  Last Vitals:  Vitals:   09/11/17 0819  BP: 136/88  Pulse: 67  Resp: 16  Temp: (!) 36.1 C  SpO2: 100%    Last Pain:  Vitals:   09/11/17 0819  TempSrc: Tympanic  PainSc: 4          Complications: No apparent anesthesia complications

## 2017-09-11 NOTE — Op Note (Signed)
Indications: [name] suffers from cervical myelopathy due to cervical stenosis and spondylosis at C3-4. He also has an extensive anterior osteophyte at C4-5.  After failing conservative management, he asked that we proceed with surgical intervention.  Findings: cervical stenosis, severe esophageal impingement by C4-5 osteophyte  Preoperative Diagnosis: Cervical myelopathy Postoperative Diagnosis: same   EBL: 25 ml IVF: 500 ml Drains: none Disposition: Extubated and Stable to PACU Complications: none  No foley catheter was placed.   Preoperative Note:   Risks of surgery discussed include: infection, bleeding, stroke, coma, death, paralysis, CSF leak, nerve/spinal cord injury, numbness, tingling, weakness, complex regional pain syndrome, recurrent stenosis and/or disc herniation, vascular injury, development of instability, neck/back pain, need for further surgery, persistent symptoms, development of deformity, and the risks of anesthesia. They understood these risks and have agreed to proceed.  Operative Note:  Procedure:  1) Cervical Disc Arthroplasty at C3/4 using a LDR Mobi-C device 2) Removal of extensive anterior osteophyte at C4-5   Procedure: After obtaining informed consent, the patient taken to the operating room, placed in supine position, general anesthesia induced.  The patient had a small shoulder roll placed behind the neck.  The patient received preop antibiotics and IV Decadron.  The patient had a neck incision outlined, was prepped and draped in usual sterile fashion. The incision was injected with local anesthetic.   An incision was opened, dissection taken down medial to the carotid artery and jugular vein, lateral to the trachea and esophagus.  The prevertebral fascia identified and a localizing x-ray demonstrated the correct level.  The longus colli were dissected laterally, and self-retaining retractors placed to open the operative field. The microscope was then  brought into the field.  During dissection, the esophagus was noted to be extensively deflected laterally by the large osteophyte at C4-5, causing tension on the esophagus.  With this complete, distractor pins were placed in the vertebral bodies of C3 and C4. The distractor was placed, and the anulus at C3/4 was opened using a bovie.  Curettes and pituitary rongeurs used to remove the majority of disk, then the drill was used to remove the posterior osteophyte and begin the foraminotomies. The nerve hook was used to elevate the posterior longitudinal ligament, which was then removed with Kerrison rongeurs. The microblunt nerve hook could be passed out the foramen bilateral at each level.   Meticulous hemostasis was obtained.  A trial spacer was used to size the disc space. Using flouroscopic guidance, a 17 mm width x 15 mm depth x 5 mm height Mobi-C was then inserted in the prepared disc space.  The caspar distractor was removed, and bone wax used for hemostasis. Due to the extensive impingement of the esophagus, an intraoperative decision was made to perform anterior osteophyte removal at C4-5. With the soft tissues carefully retracted, the osteophyte was uncovered from the mid-body of C4 to C5.  Using the high speed drill, the osteophyte was removed without violating the C4-5 disc until the anterior disc space and vertebral bodies at C4-5 were flush.  With this done, the bony edges were waxed and the area irrigated.  No active bleeding was noted.     Final AP and lateral radiographs were taken.   With the disc arthroplasty in good position, the wound was irrigated copiously with bacitracin-containing solution and meticulous hemostasis obtained.  Wound was closed in 2 layers using interrupted inverted 3-0 Vicryl sutures.  The wound was dressed with dermabond, the head of bed at 30 degrees,  taken to recovery room in stable condition.  No new postop neurological deficits were identified.  Sponge and pattie  counts were correct at the end of the procedure.     I performed the entire procedure with the assistance of Marin Olp PA as an Pensions consultant.  Meade Maw MD

## 2017-09-11 NOTE — Anesthesia Preprocedure Evaluation (Signed)
Anesthesia Evaluation  Patient identified by MRN, date of birth, ID band Patient awake    Reviewed: Allergy & Precautions, NPO status , Patient's Chart, lab work & pertinent test results, reviewed documented beta blocker date and time   Airway Mallampati: II  TM Distance: >3 FB     Dental  (+) Chipped, Dental Advisory Given, Poor Dentition, Missing, Partial Lower, Partial Upper   Pulmonary former smoker,           Cardiovascular hypertension, Pt. on medications + CAD and + Past MI  + Valvular Problems/Murmurs      Neuro/Psych  Headaches, Seizures -, Well Controlled,  PSYCHIATRIC DISORDERS Anxiety Depression  Neuromuscular disease CVA    GI/Hepatic (+) Hepatitis -, C  Endo/Other    Renal/GU      Musculoskeletal  (+) Arthritis , Osteoarthritis,    Abdominal   Peds  Hematology  (+) anemia ,   Anesthesia Other Findings EKG reviewed. Early repolarization. No cardiac symptoms. Will proceed. CVA mostly resolved, some numbness in fingers. Epilepsy 4-5 yrs ago.  Reproductive/Obstetrics                             Anesthesia Physical  Anesthesia Plan  ASA: III  Anesthesia Plan: General   Post-op Pain Management:    Induction: Intravenous  PONV Risk Score and Plan:   Airway Management Planned: Oral ETT  Additional Equipment:   Intra-op Plan:   Post-operative Plan: Extubation in OR  Informed Consent: I have reviewed the patients History and Physical, chart, labs and discussed the procedure including the risks, benefits and alternatives for the proposed anesthesia with the patient or authorized representative who has indicated his/her understanding and acceptance.     Plan Discussed with: CRNA  Anesthesia Plan Comments:         Anesthesia Quick Evaluation

## 2017-09-12 ENCOUNTER — Encounter: Payer: Self-pay | Admitting: Neurosurgery

## 2017-09-12 MED ORDER — KETOROLAC TROMETHAMINE 30 MG/ML IJ SOLN
10.0000 mg | Freq: Once | INTRAMUSCULAR | Status: AC
  Administered 2017-09-12: 9.9 mg via INTRAVENOUS
  Filled 2017-09-12: qty 1

## 2017-09-12 NOTE — Discharge Instructions (Signed)
Take the pain medicines as prescribed to you by your surgeon. Return to the ER for worsening symptoms, persistent vomiting, difficulty breathing, fever or other concerns.

## 2018-03-05 ENCOUNTER — Telehealth: Payer: Self-pay | Admitting: *Deleted

## 2018-03-05 NOTE — Telephone Encounter (Signed)
Received referral for initial lung cancer screening scan. Contacted patient and obtained smoking history, with a quit date of 14 years ago and .5 pack per day x 31 years. Discussed patient not being considered high risk and not meeting lung screening eligibility. Patient verbalizes understanding.

## 2018-07-09 ENCOUNTER — Other Ambulatory Visit: Payer: Medicaid Other

## 2018-07-09 DIAGNOSIS — R972 Elevated prostate specific antigen [PSA]: Secondary | ICD-10-CM

## 2018-07-10 LAB — PSA: PROSTATE SPECIFIC AG, SERUM: 1.6 ng/mL (ref 0.0–4.0)

## 2018-07-11 ENCOUNTER — Ambulatory Visit: Payer: Medicaid Other | Admitting: Urology

## 2018-07-31 ENCOUNTER — Ambulatory Visit (INDEPENDENT_AMBULATORY_CARE_PROVIDER_SITE_OTHER): Payer: Medicaid Other | Admitting: Urology

## 2018-07-31 ENCOUNTER — Other Ambulatory Visit: Payer: Self-pay

## 2018-07-31 ENCOUNTER — Encounter: Payer: Self-pay | Admitting: Urology

## 2018-07-31 VITALS — BP 91/55 | HR 64 | Ht 70.0 in | Wt 137.0 lb

## 2018-07-31 DIAGNOSIS — N400 Enlarged prostate without lower urinary tract symptoms: Secondary | ICD-10-CM | POA: Diagnosis not present

## 2018-07-31 DIAGNOSIS — Z8042 Family history of malignant neoplasm of prostate: Secondary | ICD-10-CM | POA: Diagnosis not present

## 2018-07-31 DIAGNOSIS — N402 Nodular prostate without lower urinary tract symptoms: Secondary | ICD-10-CM | POA: Diagnosis not present

## 2018-07-31 NOTE — Patient Instructions (Signed)

## 2018-07-31 NOTE — Progress Notes (Signed)
07/31/2018 5:08 PM   Kenneth Daniels Dec 19, 1956 [id number]  Referring provider: Theotis Burrow, MD [address] [place], [place] [address]  Chief Complaint  Patient presents with  . Benign Prostatic Hypertrophy    HPI: 62 yo M with a personal history of bladder neck obstruction and family history of prostate cancer returns today for routine annual follow-up.  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  He is no longer any BPH medications and continues to have minimal to no urinary symptoms.  He has an excellent stream and feels like he is emptying his bladder well.  No urgency frequency or nocturia.  No UTIs or gross hematuria.  Most recent PSA 1.6 on 07/09/2018.  This is up slightly from the previous year at 1.2.  + cousin died of prostate cancer, age 50.     PMH: Past Medical History:  Diagnosis Date  . 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 Anxiety   . Atypical chest pain 04/17/2016  . CAD in native artery 04/17/2016  . Cerebral infarction ([place]) 11/25/2013   Overview:  IMO Problem List Replacer Jan. 2016   . Cervical spinal stenosis 07/06/2015  . Essential (primary) hypertension 04/15/2008  . 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 Headache, migraine 11/25/2013  . Heart murmur   . Hepatitis    Hep "C"  . HLD (hyperlipidemia) 03/27/2012  . Hyperlipidemia   . Idiopathic localized osteoarthropathy 02/26/2011  . Major depressive disorder with single episode 11/14/2009  . Myocardial infarction ([place]) 2009  . 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 Seizures ([place])    Last Seizure 2013  . Stroke [facility])     Surgical History: Past Surgical History:  Procedure Laterality Date  . ABDOMINAL EXPLORATION SURGERY  2010?  [facility] BACK SURGERY    . CERVICAL DISC ARTHROPLASTY N/A 09/11/2017   Procedure: CERVICAL ANTERIOR DISC ARTHROPLASTY C3-4 POSSIBLE C4-5;  Surgeon: Meade Maw, MD;  Location: [facility] ORS;  Service: Neurosurgery;  Laterality: N/A;  . COLON  SURGERY     Colon Resection  . HERNIA REPAIR    . HOLMIUM LASER APPLICATION N/A 19/14/7829   Procedure: HOLMIUM LASER APPLICATION;  Surgeon: Hollice Espy, MD;  Location: [facility] ORS;  Service: Urology;  Laterality: N/A;  . PROSTATE ABLATION N/A 11/20/2016   Procedure: PROSTATE ABLATION;  Surgeon: Hollice Espy, MD;  Location: [facility] ORS;  Service: Urology;  Laterality: N/A;  . SOFT TISSUE TUMOR RESECTION     left thigh  . TRANSURETHRAL RESECTION OF PROSTATE N/A 11/20/2016   Procedure: TRANSURETHRAL RESECTION OF THE PROSTATE (TURP);  Surgeon: Hollice Espy, MD;  Location: [facility] ORS;  Service: Urology;  Laterality: N/A;    Home Medications:  Allergies as of 07/31/2018      Reactions   Gabapentin Other (See Comments)   Sulfa Antibiotics Rash      Medication List        Accurate as of 07/31/18  5:08 PM. Always use your most recent med list.          cholecalciferol 1000 units tablet Commonly known as:  VITAMIN D Take 1,000 Units by mouth daily.   Vitamin D-3 5000 units Tabs Take 5,000 Units by mouth daily.   citalopram 40 MG tablet Commonly known as:  CELEXA TAKE 1 TABLET DAILY BY MOUTH   lisinopril 10 MG tablet Commonly known as:  PRINIVIL,ZESTRIL TAKE 1 TABLET BY MOUTH DAILY FOR HIGH BP   methocarbamol 500 MG tablet Commonly known as:  ROBAXIN Take 1 tablet (  500 mg total) by mouth every 6 (six) hours as needed for muscle spasms.   oxyCODONE 5 MG immediate release tablet Commonly known as:  ROXICODONE Take 1 tablet (5 mg total) by mouth every 4 (four) hours as needed for breakthrough pain.   phenytoin 100 MG ER capsule Commonly known as:  DILANTIN TAKE 3 CAPSULES IN THE MORNING AND 2 CAPSULES EVERY EVENING   simvastatin 20 MG tablet Commonly known as:  ZOCOR TAKE 1 TABLET EVERY EVENING FOR HIGH CHOLESTEROL       Allergies:  Allergies  Allergen Reactions  . Gabapentin Other (See Comments)  . Sulfa Antibiotics Rash    Family History: Family History  Problem  Relation Age of Onset  . Bladder Cancer Neg Hx   . Prostate cancer Neg Hx   . Kidney cancer Neg Hx     Social History:  reports that he quit smoking about 11 years ago. His smoking use included cigarettes. He has never used smokeless tobacco. He reports that he has current or past drug history. Drug: Marijuana. He reports that he does not drink alcohol.  ROS: UROLOGY Frequent Urination?: No Hard to postpone urination?: No Burning/pain with urination?: No Get up at night to urinate?: No Leakage of urine?: No Urine stream starts and stops?: No Trouble starting stream?: No Do you have to strain to urinate?: No Blood in urine?: No Urinary tract infection?: No Sexually transmitted disease?: No Injury to kidneys or bladder?: No Painful intercourse?: No Weak stream?: No Erection problems?: No Penile pain?: No  Gastrointestinal Nausea?: No Vomiting?: No Indigestion/heartburn?: No Diarrhea?: No Constipation?: No  Constitutional Fever: No Night sweats?: No Weight loss?: Yes Fatigue?: No  Skin Skin rash/lesions?: No Itching?: No  Eyes Blurred vision?: No Double vision?: No  Ears/Nose/Throat Sore throat?: No Sinus problems?: No  Hematologic/Lymphatic Swollen glands?: No Easy bruising?: No  Cardiovascular Leg swelling?: No Chest pain?: No  Respiratory Cough?: No Shortness of breath?: No  Endocrine Excessive thirst?: No  Musculoskeletal Back pain?: No Joint pain?: Yes  Neurological Headaches?: No Dizziness?: No  Psychologic Depression?: Yes Anxiety?: No  Physical Exam: BP (!) 91/55   Pulse 64   Ht 5\' 10"  (1.778 m)   Wt 137 lb (62.1 kg)   BMI 19.66 kg/m   Constitutional:  Alert and oriented, No acute distress. HEENT: [place] AT, moist mucus membranes.  Trachea midline, no masses. Cardiovascular: No clubbing, cyanosis, or edema. Respiratory: Normal respiratory effort, no increased work of breathing. GI: Abdomen is soft, nontender, nondistended, no  abdominal masses.  Thin. Rectal: Normal sphincter tone.  40 cc prostate with nodule at left base, not previously appreciated.  Nontender. Skin: No rashes, bruises or suspicious lesions. Neurologic: Grossly intact, no focal deficits, moving all 4 extremities. Psychiatric: Normal mood and affect.  Laboratory Data: Lab Results  Component Value Date   WBC 5.7 08/30/2017   HGB 13.4 08/30/2017   HCT 39.7 (L) 08/30/2017   MCV 90.8 08/30/2017   PLT 235 08/30/2017    Lab Results  Component Value Date   CREATININE 0.88 08/30/2017   PSA as above  Urinalysis N/a  Pertinent Imaging: N/a  Assessment & Plan:    1. Prostate nodule Rectal exam concerning today, PSA up slightly to 1.6 from 1.2 Recommend prostate biopsy especially given his strong family history of advanced metastatic prostate cancer We discussed prostate biopsy procedure in detail today, all questions answered  We discussed prostate biopsy in detail including the procedure itself, the risks of blood in  the urine, stool, and ejaculate, serious infection, and discomfort. He is willing to proceed with this as discussed.  2. Family history of prostate cancer Risk factor for prostate cancer  3. BPH without obstruction/lower urinary tract symptoms Excellent symptom control status post TUIBN   Return in about 1 month (around 08/28/2018) for prostate biopsy.  Hollice Espy, MD  [facility] [address] [place], [place] [address] [phone]

## 2018-09-09 ENCOUNTER — Other Ambulatory Visit: Payer: Self-pay | Admitting: Urology

## 2018-09-09 ENCOUNTER — Ambulatory Visit (INDEPENDENT_AMBULATORY_CARE_PROVIDER_SITE_OTHER): Payer: Medicaid Other | Admitting: Urology

## 2018-09-09 ENCOUNTER — Encounter: Payer: Self-pay | Admitting: Urology

## 2018-09-09 VITALS — BP 96/61 | HR 60 | Ht 70.0 in | Wt 140.0 lb

## 2018-09-09 DIAGNOSIS — N402 Nodular prostate without lower urinary tract symptoms: Secondary | ICD-10-CM | POA: Diagnosis not present

## 2018-09-09 DIAGNOSIS — R972 Elevated prostate specific antigen [PSA]: Secondary | ICD-10-CM

## 2018-09-09 MED ORDER — LEVOFLOXACIN 500 MG PO TABS
500.0000 mg | ORAL_TABLET | Freq: Once | ORAL | Status: AC
  Administered 2018-09-09: 500 mg via ORAL

## 2018-09-09 MED ORDER — GENTAMICIN SULFATE 40 MG/ML IJ SOLN
80.0000 mg | Freq: Once | INTRAMUSCULAR | Status: AC
Start: 2018-09-09 — End: 2018-09-09
  Administered 2018-09-09: 80 mg via INTRAMUSCULAR

## 2018-09-09 NOTE — Progress Notes (Signed)
   09/09/18  CC:  Chief Complaint  Patient presents with  . Prostate Biopsy    HPI: 62 year old male with history of elevated PSA and a nodule at the left base who presents today for prostate biopsy.  Please see previous note for details.  Blood pressure 96/61, pulse 60, height 5\' 10"  (1.778 m), weight 140 lb (63.5 kg). NED. A&Ox3.   No respiratory distress   Abd soft, NT, ND Normal sphincter tone  Prostate Biopsy Procedure   Informed consent was obtained after discussing risks/benefits of the procedure.  A time out was performed to ensure correct patient identity.  Pre-Procedure: - Gentamicin given prophylactically - Levaquin 500 mg administered PO -Transrectal Ultrasound performed revealing a 42 gm prostate -No significant hypoechoic or median lobe noted  Procedure: - Prostate block performed using 10 cc 1% lidocaine and biopsies taken from sextant areas, a total of 12 under ultrasound guidance.  Post-Procedure: - Patient tolerated the procedure well - He was counseled to seek immediate medical attention if experiences any severe pain, significant bleeding, or fevers - Return in one to two week to discuss biopsy results  Hollice Espy, MD

## 2018-09-12 LAB — PATHOLOGY REPORT

## 2018-09-15 ENCOUNTER — Telehealth: Payer: Self-pay

## 2018-09-15 NOTE — Telephone Encounter (Signed)
Called pt no answer. LM for pt informing him of the information below, per DPR. Pt advised to call back and schedule appt.

## 2018-09-15 NOTE — Telephone Encounter (Signed)
-----   Message from Hollice Espy, MD sent at 09/14/2018  4:30 PM EDT ----- No CANCER!!!! Awesome news.  Lets have him follow up in 1 year with PSA/ DRE given that his PSA is very low.   Hollice Espy, MD

## 2018-09-16 ENCOUNTER — Other Ambulatory Visit: Payer: Self-pay | Admitting: Urology

## 2018-09-18 ENCOUNTER — Telehealth: Payer: Self-pay | Admitting: Urology

## 2018-09-19 NOTE — Telephone Encounter (Signed)
Lm for pt to cb to confirm that he does not need to keep app on 09-23-18  Per dr. Erlene Quan   MB

## 2018-09-23 ENCOUNTER — Ambulatory Visit: Payer: Medicaid Other | Admitting: Urology

## 2019-01-30 ENCOUNTER — Telehealth: Payer: Self-pay

## 2019-01-30 NOTE — Telephone Encounter (Signed)
L MOM to schedule NP appt referred by PCP

## 2019-03-30 ENCOUNTER — Telehealth: Payer: Self-pay | Admitting: Cardiovascular Disease

## 2019-03-30 NOTE — Telephone Encounter (Signed)
Spoke with patient and reviewed that we needed to change his visit to a telephone or video visit. He was agreeable to this given the current situation. Read consent to him over the phone and he gave verbal consent with no further questions at this time.   YOUR CARDIOLOGY TEAM HAS ARRANGED FOR AN E-VISIT FOR YOUR APPOINTMENT - PLEASE REVIEW IMPORTANT INFORMATION BELOW SEVERAL DAYS PRIOR TO YOUR APPOINTMENT  Due to the recent COVID-19 pandemic, we are transitioning in-person office visits to tele-medicine visits in an effort to decrease unnecessary exposure to our patients and staff. Medicare and most insurances are covering these visits without a copay needed. You will need a smartphone if possible. We also encourage you to sign up for MyChart. For patients that do not have this, we can still complete the visit using a regular telephone but do prefer a smartphone to enable video when possible. You may have a close family member that can help. If possible, we also ask that you have a blood pressure cuff and scale at home to measure your blood pressure, heart rate and weight prior to your scheduled appointment. Patients with clinical needs that need an in-person evaluation and testing will still be able to come to the office if absolutely necessary. If you have any questions, feel free to call our office.    2-3 DAYS BEFORE YOUR APPOINTMENT  You will receive a telephone call from one of our [facility] team members - your caller ID may say "Unknown caller." If this is a video visit, we will confirm that you have been able to download the WebEx app. We will remind you check your blood pressure, heart rate and weight prior to your scheduled appointment. If you have an Apple Watch or Kardia, please upload any pertinent ECG strips the day before or morning of your appointment to [place]. Our staff will also make sure you have reviewed the consent and agree to move forward with your scheduled tele-health visit.      THE DAY OF YOUR APPOINTMENT  Approximately 15 minutes prior to your scheduled appointment, you will receive a telephone call from one of [facility] team - your caller ID may say "Unknown caller."  Our staff will confirm medications, vital signs for the day and any symptoms you may be experiencing. Please have this information available prior to the time of visit start. It may also be helpful for you to have a pad of paper and pen handy for any instructions given during your visit. They will also walk you through joining the WebEx smartphone meeting if this is a video visit.    CONSENT FOR TELE-HEALTH VISIT - PLEASE RVIEW  I hereby voluntarily request, consent and authorize [facility] and its employed or contracted physicians, physician assistants, nurse practitioners or other licensed health care professionals (the Practitioner), to provide me with telemedicine health care services (the "Services") as deemed necessary by the treating Practitioner. I acknowledge and consent to receive the Services by the Practitioner via telemedicine. I understand that the telemedicine visit will involve communicating with the Practitioner through live audiovisual communication technology and the disclosure of certain medical information by electronic transmission. I acknowledge that I have been given the opportunity to request an in-person assessment or other available alternative prior to the telemedicine visit and am voluntarily participating in the telemedicine visit.  I understand that I have the right to withhold or withdraw my consent to the use of telemedicine in the course of my care at any  time, without affecting my right to future care or treatment, and that the Practitioner or I may terminate the telemedicine visit at any time. I understand that I have the right to inspect all information obtained and/or recorded in the course of the telemedicine visit and may receive copies of available information  for a reasonable fee.  I understand that some of the potential risks of receiving the Services via telemedicine include:  Marland Kitchen Delay or interruption in medical evaluation due to technological equipment failure or disruption; . Information transmitted may not be sufficient (e.g. poor resolution of images) to allow for appropriate medical decision making by the Practitioner; and/or  . In rare instances, security protocols could fail, causing a breach of personal health information.  Furthermore, I acknowledge that it is my responsibility to provide information about my medical history, conditions and care that is complete and accurate to the best of my ability. I acknowledge that Practitioner's advice, recommendations, and/or decision may be based on factors not within their control, such as incomplete or inaccurate data provided by me or distortions of diagnostic images or specimens that may result from electronic transmissions. I understand that the practice of medicine is not an exact science and that Practitioner makes no warranties or guarantees regarding treatment outcomes. I acknowledge that I will receive a copy of this consent concurrently upon execution via email to the email address I last provided but may also request a printed copy by calling the office of [facility].    I understand that my insurance will be billed for this visit.   I have read or had this consent read to me. . I understand the contents of this consent, which adequately explains the benefits and risks of the Services being provided via telemedicine.  . I have been provided ample opportunity to ask questions regarding this consent and the Services and have had my questions answered to my satisfaction. . I give my informed consent for the services to be provided through the use of telemedicine in my medical care  By participating in this telemedicine visit I agree to the above.

## 2019-04-02 NOTE — Telephone Encounter (Signed)
Left voicemail message for patient to see if we could do a video visit for his appointment. He previously consented to telephone visit. Advised to call and just let us know.

## 2019-04-02 NOTE — Telephone Encounter (Signed)
Spoke with patient and he was agreeable to video visit for his upcoming appointment. Advised that provider would send a text with link to click for the visit. He verbalized understanding with no further questions at this time.

## 2019-04-03 ENCOUNTER — Telehealth: Payer: Self-pay

## 2019-04-03 NOTE — Telephone Encounter (Signed)
LMOV for patient to call back.  Would like to do a trial doxy with patient to make sure video and audio will work.

## 2019-04-06 ENCOUNTER — Encounter

## 2019-04-06 ENCOUNTER — Encounter: Payer: Self-pay | Admitting: Cardiovascular Disease

## 2019-04-06 ENCOUNTER — Other Ambulatory Visit: Payer: Self-pay

## 2019-04-06 ENCOUNTER — Telehealth (INDEPENDENT_AMBULATORY_CARE_PROVIDER_SITE_OTHER): Payer: Medicaid Other | Admitting: Cardiovascular Disease

## 2019-04-06 VITALS — Ht 70.0 in | Wt 145.0 lb

## 2019-04-06 DIAGNOSIS — F17210 Nicotine dependence, cigarettes, uncomplicated: Secondary | ICD-10-CM | POA: Insufficient documentation

## 2019-04-06 DIAGNOSIS — G453 Amaurosis fugax: Secondary | ICD-10-CM | POA: Insufficient documentation

## 2019-04-06 DIAGNOSIS — M542 Cervicalgia: Secondary | ICD-10-CM

## 2019-04-06 DIAGNOSIS — G40909 Epilepsy, unspecified, not intractable, without status epilepticus: Secondary | ICD-10-CM

## 2019-04-06 DIAGNOSIS — I739 Peripheral vascular disease, unspecified: Secondary | ICD-10-CM | POA: Diagnosis not present

## 2019-04-06 DIAGNOSIS — I6529 Occlusion and stenosis of unspecified carotid artery: Secondary | ICD-10-CM

## 2019-04-06 MED ORDER — ASPIRIN EC 81 MG PO TBEC: 81.0000 mg | DELAYED_RELEASE_TABLET | Freq: Every day | ORAL | 3 refills | Status: DC

## 2019-04-06 NOTE — Progress Notes (Addendum)
Virtual Visit via Video Note   This visit type was conducted due to national recommendations for restrictions regarding the COVID-19 Pandemic (e.g. social distancing) in an effort to limit this patient's exposure and mitigate transmission in our community.  Due to her co-morbid illnesses, this patient is at least at moderate risk for complications without adequate follow up.  This format is felt to be most appropriate for this patient at this time.  All issues noted in this document were discussed and addressed.  A limited physical exam was performed with this format.  Please refer to the patient's chart for her consent to telehealth for [facility].    Date:  04/06/2019   ID:  Kenneth Daniels, DOB 1956/01/19, MRN [id number]  Patient Location:  [address] [place] [address]   Provider location:   [facility], [place] office  PCP:  Alene Mires Elyse Jarvis, MD  Cardiologist:  [facility]  Chief Complaint:  Neck pain, amaurosis fugax, blindness right eye    History of Present Illness:    Kenneth Daniels is a 63 y.o. male who presents via audio/video conferencing for a telehealth visit today.   The patient does not symptoms concerning for COVID-19 infection (fever, chills, cough, or new SHORTNESS OF BREATH).   Patient has a past medical history of Smoker.remote, stopped 63 yo carotid dz Epilepsy treatment for his hepatitis C.  mental health issues.  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 C3-4 moderate spinal canal stenosis as well as some neural foraminal stenosis. suffered a myocardial infarction at [facility] in the past. Records from this are currently unavailable. Referred by primary care physician for amaurosis Fugax, right eye, carotid stenosis  Reports he has been having worsening headaches, neck pain Pain radiates up the back of his neck to behind both ears Balance is poor, followed by neurology   Also reports having  amaurosis fugax right eye Fluttery vision, tunnel vision at times Feels like something comes over his eye, like a shade,  then goes away  No significant SOB on exertion Sedentary, takes care of elderly family at home No chest pain on exertion  No new EKG,  Previous EKG 2018: Normal sinus rhythm LVH with repol abn Rate 51  BP "fine" Does not have any numbers  Discussed previous MI at [facility] many years ago, Records from this are currently unavailable.  "chest pain, SOB", called EMS, given NTG No prior cardiac catheterization   Prior CV studies:   The following studies were reviewed today:  2014 ICA of a less than 40%stenosis.  Total chol 135, LDL 61 (02/2018) HBA1C 5.4  MRI 2018 cervical  Multilevel spondylosis as described. Progressive stenosis at C3-4, with cord flattening but no abnormal cord signal. Canal diameter 6-7 mm. LEFT greater than RIGHT C4 foraminal narrowing. Modic type 1 endplate appearance, consistent with active ongoing degenerative change. Unremarkable postsurgical change at C6-7, with only mild LEFT C7 foraminal narrowing, and no postsurgical complication. Advanced disc space narrowing at C5-6.  LEFT C6 foraminal narrowing.  2017  Stress test, preop for back surgery  The study is normal.  This is a low risk study.  The left ventricular ejection fraction is normal (55-65%).  There was no ST segment deviation noted during stress.   Past Medical History:  Diagnosis Date  . 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 Anxiety   . Atypical chest pain 04/17/2016  . CAD in native artery 04/17/2016  . Cerebral infarction ([place]) 11/25/2013  Overview:  IMO Problem List Replacer Jan. 2016   . Cervical spinal stenosis 07/06/2015  . Essential (primary) hypertension 04/15/2008  . 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 Headache, migraine 11/25/2013  . Heart murmur   . Hepatitis    Hep "C"  . HLD (hyperlipidemia) 03/27/2012  . Hyperlipidemia   . 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 Major depressive disorder with single episode 11/14/2009  . Myocardial infarction ([place]) 2009  . 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 Seizures ([place])    Last Seizure 2013  . Stroke [facility])    Past Surgical History:  Procedure Laterality Date  . ABDOMINAL EXPLORATION SURGERY  2010?  Marland Kitchen BACK SURGERY    . CERVICAL DISC ARTHROPLASTY N/A 09/11/2017   Procedure: CERVICAL ANTERIOR DISC ARTHROPLASTY C3-4 POSSIBLE C4-5;  Surgeon: Meade Maw, MD;  Location: [facility] ORS;  Service: Neurosurgery;  Laterality: N/A;  . COLON SURGERY     Colon Resection  . HERNIA REPAIR    . HOLMIUM LASER APPLICATION N/A 85/46/2703   Procedure: HOLMIUM LASER APPLICATION;  Surgeon: Hollice Espy, MD;  Location: [facility] ORS;  Service: Urology;  Laterality: N/A;  . PROSTATE ABLATION N/A 11/20/2016   Procedure: PROSTATE ABLATION;  Surgeon: Hollice Espy, MD;  Location: [facility] ORS;  Service: Urology;  Laterality: N/A;  . SOFT TISSUE TUMOR RESECTION     left thigh  . TRANSURETHRAL RESECTION OF PROSTATE N/A 11/20/2016   Procedure: TRANSURETHRAL RESECTION OF THE PROSTATE (TURP);  Surgeon: Hollice Espy, MD;  Location: [facility] ORS;  Service: Urology;  Laterality: N/A;     Current Meds  Medication Sig  . cholecalciferol (VITAMIN D) 1000 units tablet Take 1,000 Units by mouth daily.  . Cholecalciferol (VITAMIN D-3) 5000 units TABS Take 5,000 Units by mouth daily.  . citalopram (CELEXA) 40 MG tablet TAKE 1 TABLET DAILY BY MOUTH  . lisinopril (PRINIVIL,ZESTRIL) 10 MG tablet TAKE 1 TABLET BY MOUTH DAILY FOR HIGH BP  . phenytoin (DILANTIN) 100 MG ER capsule TAKE 3 CAPSULES IN THE MORNING AND 2 CAPSULES EVERY EVENING  . simvastatin (ZOCOR) 20 MG tablet TAKE 1 TABLET EVERY EVENING FOR HIGH CHOLESTEROL     Allergies:   Gabapentin and Sulfa antibiotics   Social History   Tobacco Use  . Smoking status: Former Smoker    Types: Cigarettes    Last attempt to quit: 08/01/2007    Years since  quitting: 11.6  . Smokeless tobacco: Never Used  Substance Use Topics  . Alcohol use: No    Alcohol/week: 0.0 standard drinks  . Drug use: Yes    Types: Marijuana     Current Outpatient Medications on File Prior to Visit  Medication Sig Dispense Refill  . cholecalciferol (VITAMIN D) 1000 units tablet Take 1,000 Units by mouth daily.    . Cholecalciferol (VITAMIN D-3) 5000 units TABS Take 5,000 Units by mouth daily.    . citalopram (CELEXA) 40 MG tablet TAKE 1 TABLET DAILY BY MOUTH  2  . lisinopril (PRINIVIL,ZESTRIL) 10 MG tablet TAKE 1 TABLET BY MOUTH DAILY FOR HIGH BP  2  . phenytoin (DILANTIN) 100 MG ER capsule TAKE 3 CAPSULES IN THE MORNING AND 2 CAPSULES EVERY EVENING  10  . simvastatin (ZOCOR) 20 MG tablet TAKE 1 TABLET EVERY EVENING FOR HIGH CHOLESTEROL  3   No current facility-administered medications on file prior to visit.      Family Hx: The patient's family history is negative for Bladder Cancer, Prostate cancer, and Kidney cancer.  ROS:  Please see the history of present illness.    Review of Systems  Constitutional: Negative.   Respiratory: Negative.   Cardiovascular: Negative.   Gastrointestinal: Negative.   Musculoskeletal: Positive for neck pain.  Neurological: Negative.   Psychiatric/Behavioral: Negative.   All other systems reviewed and are negative.     Labs/Other Tests and Data Reviewed:    Recent Labs: No results found for requested labs within last 8760 hours.   Recent Lipid Panel No results found for: CHOL, TRIG, HDL, CHOLHDL, LDLCALC, LDLDIRECT  Wt Readings from Last 3 Encounters:  04/06/19 145 lb (65.8 kg)  09/09/18 140 lb (63.5 kg)  07/31/18 137 lb (62.1 kg)     Exam:    Vital Signs: Vital signs as detailed above in HPI  Well nourished, well developed male in no acute distress. Constitutional:  oriented to person, place, and time. No distress.  Head: Normocephalic and atraumatic.  Eyes:  no discharge. No scleral icterus.  Neck:  Normal range of motion. Neck supple.  Pulmonary/Chest: No audible wheezing, no distress, appears comfortable Musculoskeletal: Normal range of motion.  no  tenderness or deformity.  Neurological:   Coordination normal. Full exam not performed Skin:  No rash Psychiatric:  normal mood and affect. behavior is normal. Thought content normal.     ASSESSMENT & PLAN:     PAD (peripheral artery disease) (HCC) Known carotid disease  We have ordered carotid ultrasound.  We will also visualize vertebral arteries  stenosis of carotid artery, unspecified laterality - Plan: VAS US CAROTID Previously less than 40% over 6 years ago Repeat study ordered  Amaurosis fugax of right eye Symptoms concerning, will start with carotid ultrasound He may benefit from brain MRI if symptoms persist He is on aspirin Cholesterol is at goal Also could consider seeing ophthalmology  Cigarette smoker We have encouraged him to continue to work on weaning his cigarettes and smoking cessation. He will continue to work on this and does not want any assistance with chantix.   Neck pain Most of his neck pain symptoms sound musculoskeletal, Arthritis, Known stenoses seen on MRI neck 2 years ago Would likely benefit from referral to neurosurgery for further evaluation They may need repeat MRI neck prior to referral  Nonintractable epilepsy without status epilepticus, unspecified epilepsy type ([place]) Followed by neurology Reports symptoms are stable   COVID-19 Education: The signs and symptoms of COVID-19 were discussed with the patient and how to seek care for testing (follow up with PCP or arrange E-visit).  The importance of social distancing was discussed today.  Patient Risk:   After full review of this patients clinical status, I feel that they are at least moderate risk at this time.  Time:   Today, I have spent 25 minutes with the patient with telehealth technology discussing TIA/amaurosis fugax symptoms,  neck pain, med med of cardiovascular risk.     Medication Adjustments/Labs and Tests Ordered: Current medicines are reviewed at length with the patient today.  Concerns regarding medicines are outlined above.   Tests Ordered: Carotid ultrasound  Medication Changes: No changes made   Disposition: We will call him with the results of his carotid ultrasound  Signed, Ida Rogue, MD  04/06/2019 1:37 PM    [place] [facility] [address], [place], [place] [address]

## 2019-04-06 NOTE — Progress Notes (Signed)
Please define time frame for Carotid

## 2019-04-06 NOTE — Patient Instructions (Addendum)
Medication Instructions:  Your physician has recommended you make the following change in your medication:  1.  Please start aspirin 81 mg daily  If you need a refill on your cardiac medications before your next appointment, please call your pharmacy.    Lab work: No new labs needed   If you have labs (blood work) drawn today and your tests are completely normal, you will receive your results only by: Marland Kitchen MyChart Message (if you have MyChart) OR . A paper copy in the mail If you have any lab test that is abnormal or we need to change your treatment, we will call you to review the results.   Testing/Procedures: Your physician has requested that you have a carotid duplex. This test is an ultrasound of the carotid arteries in your neck. It looks at blood flow through these arteries that supply the brain with blood. Allow one hour for this exam. There are no restrictions or special instructions.   Follow-Up: At [facility], you and your health needs are our priority.  As part of our continuing mission to provide you with exceptional heart care, we have created designated Provider Care Teams.  These Care Teams include your primary Cardiologist (physician) and Advanced Practice Providers (APPs -  Physician Assistants and Nurse Practitioners) who all work together to provide you with the care you need, when you need it.  . You will need a follow up appointment as needed  . Providers on your designated Care Team:   . Murray Hodgkins, NP . Christell Faith, PA-C . Marrianne Mood, PA-C  Any Other Special Instructions Will Be Listed Below (If Applicable).  For educational health videos Log in to : [email or web address] Or : [email or web address], password : [email or web address]   Carotid Artery Disease  The carotid arteries are arteries on both sides of the neck. They carry blood to the brain, face, and neck. Carotid artery disease happens when these arteries become smaller (narrow) or get blocked. If these arteries  become smaller or get blocked, you are more likely to have a stroke or a warning stroke (transient ischemic attack). Follow these instructions at home:  Take over-the-counter and prescription medicines only as told by your doctor.  Make sure you understand all instructions about your medicines. Do not stop taking your medicines without talking to your doctor first.  Follow your doctor's diet instructions. It is important to follow a healthy diet. ? Eat foods that include plenty of: ? Fresh fruits. ? Vegetables. ? Lean meats. ? Avoid these foods: ? Foods that are high in fat. ? Foods that are high in salt (sodium). ? Foods that are fried. ? Foods that are processed. ? Foods that have few good nutrients (poor nutritional value).  Keep a healthy weight.  Stay active. Get at least 30 minutes of activity every day.  Do not smoke.  Limit alcohol use to: ? No more than 2 drinks a day for men. ? No more than 1 drink a day for women who are not pregnant.  Do not use illegal drugs.  Keep all follow-up visits as told by your doctor. This is important. Contact a doctor if: Get help right away if:  You have any symptoms of stroke or TIA. The acronym BEFAST is an easy way to remember the main warning signs of stroke. ? B = Balance problems. Signs include dizziness, sudden trouble walking, or loss of balance ? E = Eye problems. This includes trouble seeing or a sudden change in vision. ?  F = Face changes. This includes sudden weakness or numbness of the face, or the face or eyelid drooping to one side. ? A = Arm weakness or numbness. This happens suddenly and usually on one side of the body. ? S = Speech problems. This includes trouble speaking or trouble understanding. ? T = Time. Time to call 911 or seek emergency care. Do not wait to see if symptoms go away. Make note of the time your symptoms started.  Other signs of stroke may include: ? A sudden, severe headache with no known  cause. ? Feeling sick to your stomach (nauseous) or throwing up (vomiting). ? Seizure. Call your local emergency services (911 in [place] Do notdrive yourself to the clinic or hospital. Summary  The carotid arteries are arteries on both sides of the neck.  If these arteries get smaller or get blocked, you are more likely to have a stroke or a warning stroke (transient ischemic attack).  Take over-the-counter and prescription medicines only as told by your doctor.  Keep all follow-up visits as told by your doctor. This is important. This information is not intended to replace advice given to you by your health care provider. Make sure you discuss any questions you have with your health care provider. Document Released: 12/03/2012 Document Revised: 12/12/2017 Document Reviewed: 12/12/2017 Elsevier Interactive Patient Education  2019 [employer].

## 2019-04-09 NOTE — Progress Notes (Signed)
Next available appointment would be fine.

## 2019-04-10 ENCOUNTER — Other Ambulatory Visit: Payer: Self-pay | Admitting: Cardiovascular Disease

## 2019-04-10 DIAGNOSIS — G453 Amaurosis fugax: Secondary | ICD-10-CM

## 2019-04-16 ENCOUNTER — Other Ambulatory Visit: Payer: Self-pay

## 2019-04-16 ENCOUNTER — Ambulatory Visit (INDEPENDENT_AMBULATORY_CARE_PROVIDER_SITE_OTHER): Payer: Medicaid Other

## 2019-04-16 DIAGNOSIS — G453 Amaurosis fugax: Secondary | ICD-10-CM

## 2019-05-07 ENCOUNTER — Other Ambulatory Visit: Payer: Self-pay | Admitting: Student

## 2019-05-07 DIAGNOSIS — M5412 Radiculopathy, cervical region: Secondary | ICD-10-CM

## 2019-05-21 ENCOUNTER — Ambulatory Visit
Admission: RE | Admit: 2019-05-21 | Discharge: 2019-05-21 | Disposition: A | Discharge status: Home / Self Care | Payer: Medicaid Other | Source: Ambulatory Visit | Attending: Student | Admitting: Student

## 2019-05-21 ENCOUNTER — Other Ambulatory Visit: Payer: Self-pay

## 2019-05-21 DIAGNOSIS — M5412 Radiculopathy, cervical region: Secondary | ICD-10-CM | POA: Diagnosis present

## 2019-07-30 ENCOUNTER — Other Ambulatory Visit: Payer: Self-pay | Admitting: Family Medicine

## 2019-07-30 DIAGNOSIS — R1011 Right upper quadrant pain: Secondary | ICD-10-CM

## 2019-08-06 ENCOUNTER — Ambulatory Visit
Admission: RE | Admit: 2019-08-06 | Discharge: 2019-08-06 | Disposition: A | Discharge status: Home / Self Care | Payer: Medicaid Other | Source: Ambulatory Visit | Attending: Family Medicine | Admitting: Family Medicine

## 2019-08-06 ENCOUNTER — Other Ambulatory Visit: Payer: Self-pay

## 2019-08-06 DIAGNOSIS — R1011 Right upper quadrant pain: Secondary | ICD-10-CM | POA: Diagnosis present

## 2019-09-11 ENCOUNTER — Other Ambulatory Visit: Payer: Self-pay | Admitting: Family Medicine

## 2019-09-11 DIAGNOSIS — R972 Elevated prostate specific antigen [PSA]: Secondary | ICD-10-CM

## 2019-09-14 ENCOUNTER — Other Ambulatory Visit: Payer: Medicaid Other

## 2019-09-14 ENCOUNTER — Other Ambulatory Visit: Payer: Self-pay

## 2019-09-14 DIAGNOSIS — R972 Elevated prostate specific antigen [PSA]: Secondary | ICD-10-CM

## 2019-09-15 LAB — PSA: Prostate Specific Ag, Serum: 1.4 ng/mL (ref 0.0–4.0)

## 2019-09-16 ENCOUNTER — Ambulatory Visit: Payer: Medicaid Other | Admitting: Urology

## 2019-10-06 ENCOUNTER — Encounter: Payer: Self-pay | Admitting: Urology

## 2019-10-06 ENCOUNTER — Ambulatory Visit (INDEPENDENT_AMBULATORY_CARE_PROVIDER_SITE_OTHER): Payer: Medicaid Other | Admitting: Urology

## 2019-10-06 ENCOUNTER — Other Ambulatory Visit: Payer: Self-pay

## 2019-10-06 VITALS — BP 115/74 | HR 58 | Ht 70.0 in | Wt 148.0 lb

## 2019-10-06 DIAGNOSIS — N402 Nodular prostate without lower urinary tract symptoms: Secondary | ICD-10-CM | POA: Diagnosis not present

## 2019-10-06 DIAGNOSIS — R350 Frequency of micturition: Secondary | ICD-10-CM | POA: Diagnosis not present

## 2019-10-06 DIAGNOSIS — N401 Enlarged prostate with lower urinary tract symptoms: Secondary | ICD-10-CM | POA: Diagnosis not present

## 2019-10-06 DIAGNOSIS — N400 Enlarged prostate without lower urinary tract symptoms: Secondary | ICD-10-CM | POA: Insufficient documentation

## 2019-10-06 DIAGNOSIS — Z8042 Family history of malignant neoplasm of prostate: Secondary | ICD-10-CM

## 2019-10-06 LAB — BLADDER SCAN AMB NON-IMAGING

## 2019-10-06 NOTE — Progress Notes (Signed)
10/06/2019 9:45 AM   [name] [id number]  Referring provider: Theotis Burrow, MD [address] [place],  [place] [address]  Chief Complaint  Patient presents with  . Elevated PSA    HPI: 63 year old male with a personal history of neck obstruction, elevated PSA returns today for routine annual follow-up.  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  He is no longer any BPH medications and continues to have minimal to no urinary symptoms.   More recently, he was noted to have a nodule the left base of the prostate in 2019.  He underwent prostate biopsy 08/2018 due to the presence of the nodule and a strong family history of advanced metastatic prostate cancer.  Prostate biopsy was benign.  TRUS vol 42 g.    PSA today 1.4 on 09/14/2019.  He reports today that he has had increased urinary symptoms which primarily include weak stream frequency and urgency.  He gets up 2 times at night to void.  This is become only mildly bothersome to him.  He is worried that this is related to his underlying neurological condition only following up with his neurosurgeon/neurologist.  He reports that he had his urine checked by his primary care several times and did not have a urinary tract infection.  He is unable to provide a UA today.  No dysuria or gross hematuria.  IPSS    Row Name 10/06/19 0900         International Prostate Symptom Score   How often have you had the sensation of not emptying your bladder?  Almost always     How often have you had to urinate less than every two hours?  More than half the time     How often have you found you stopped and started again several times when you urinated?  Almost always     How often have you found it difficult to postpone urination?  Almost always     How often have you had a weak urinary stream?  Almost always     How often have you had to strain to start urination?  About half the time     How many  times did you typically get up at night to urinate?  4 Times     Total IPSS Score  31       Quality of Life due to urinary symptoms   If you were to spend the rest of your life with your urinary condition just the way it is now how would you feel about that?  Terrible        Score:  1-7 Mild 8-19 Moderate 20-35 Severe   PMH: Past Medical History:  Diagnosis Date  . 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 Anxiety   . Atypical chest pain 04/17/2016  . CAD in native artery 04/17/2016  . Cerebral infarction ([place]) 11/25/2013   Overview:  IMO Problem List Replacer Jan. 2016   . Cervical spinal stenosis 07/06/2015  . Essential (primary) hypertension 04/15/2008  . 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 Headache, migraine 11/25/2013  . Heart murmur   . Hepatitis    Hep "C"  . HLD (hyperlipidemia) 03/27/2012  . Hyperlipidemia   . Idiopathic localized osteoarthropathy 02/26/2011  . Major depressive disorder with single episode 11/14/2009  . Myocardial infarction ([place]) 2009  . 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 Seizures ([place])    Last Seizure 2013  . Stroke [facility])  Surgical History: Past Surgical History:  Procedure Laterality Date  . ABDOMINAL EXPLORATION SURGERY  2010?  Marland Kitchen BACK SURGERY    . CERVICAL DISC ARTHROPLASTY N/A 09/11/2017   Procedure: CERVICAL ANTERIOR DISC ARTHROPLASTY C3-4 POSSIBLE C4-5;  Surgeon: Meade Maw, MD;  Location: [facility] ORS;  Service: Neurosurgery;  Laterality: N/A;  . COLON SURGERY     Colon Resection  . HERNIA REPAIR    . HOLMIUM LASER APPLICATION N/A [id number]   Procedure: HOLMIUM LASER APPLICATION;  Surgeon: Hollice Espy, MD;  Location: [facility] ORS;  Service: Urology;  Laterality: N/A;  . PROSTATE ABLATION N/A 11/20/2016   Procedure: PROSTATE ABLATION;  Surgeon: Hollice Espy, MD;  Location: [facility] ORS;  Service: Urology;  Laterality: N/A;  . SOFT TISSUE TUMOR RESECTION     left thigh  . TRANSURETHRAL RESECTION OF PROSTATE N/A 11/20/2016    Procedure: TRANSURETHRAL RESECTION OF THE PROSTATE (TURP);  Surgeon: Hollice Espy, MD;  Location: [facility] ORS;  Service: Urology;  Laterality: N/A;    Home Medications:  Allergies as of 10/06/2019      Reactions   Gabapentin Other (See Comments)   Sulfa Antibiotics Rash      Medication List       Accurate as of October 64, 2020  9:45 AM. If you have any questions, ask your nurse or doctor.        aspirin EC 81 MG tablet Take 1 tablet (81 mg total) by mouth daily.   cholecalciferol 1000 units tablet Commonly known as: VITAMIN D Take 1,000 Units by mouth daily.   Vitamin D-3 125 MCG (5000 UT) Tabs Take 5,000 Units by mouth daily.   citalopram 40 MG tablet Commonly known as: CELEXA TAKE 1 TABLET DAILY BY MOUTH   gabapentin 600 MG tablet Commonly known as: NEURONTIN Take by mouth.   lisinopril 10 MG tablet Commonly known as: ZESTRIL TAKE 1 TABLET BY MOUTH DAILY FOR HIGH BP   phenytoin 100 MG ER capsule Commonly known as: DILANTIN TAKE 3 CAPSULES IN THE MORNING AND 2 CAPSULES EVERY EVENING   pregabalin 75 MG capsule Commonly known as: LYRICA Take by mouth.   simvastatin 20 MG tablet Commonly known as: ZOCOR TAKE 1 TABLET EVERY EVENING FOR HIGH CHOLESTEROL       Allergies:  Allergies  Allergen Reactions  . Gabapentin Other (See Comments)  . Sulfa Antibiotics Rash    Family History: Family History  Problem Relation Age of Onset  . Bladder Cancer Neg Hx   . Prostate cancer Neg Hx   . Kidney cancer Neg Hx     Social History:  reports that he quit smoking about 12 years ago. His smoking use included cigarettes. He has never used smokeless tobacco. He reports current drug use. Drug: Marijuana. He reports that he does not drink alcohol.  ROS: UROLOGY Frequent Urination?: No Hard to postpone urination?: No Burning/pain with urination?: No Get up at night to urinate?: No Leakage of urine?: No Urine stream starts and stops?: No Trouble starting stream?: No  Do you have to strain to urinate?: No Blood in urine?: No Urinary tract infection?: No Sexually transmitted disease?: No Injury to kidneys or bladder?: No Painful intercourse?: No Weak stream?: No Erection problems?: No Penile pain?: No  Gastrointestinal Nausea?: No Vomiting?: No Indigestion/heartburn?: No Diarrhea?: No Constipation?: No  Constitutional Fever: No Night sweats?: No Weight loss?: No Fatigue?: No  Skin Skin rash/lesions?: No Itching?: No  Eyes Blurred vision?: No Double vision?: No  Ears/Nose/Throat Sore throat?: No Sinus  problems?: No  Hematologic/Lymphatic Swollen glands?: No Easy bruising?: No  Cardiovascular Leg swelling?: No Chest pain?: No  Respiratory Cough?: No Shortness of breath?: No  Endocrine Excessive thirst?: No  Musculoskeletal Back pain?: No Joint pain?: No  Neurological Headaches?: No Dizziness?: No  Psychologic Depression?: No Anxiety?: No  Physical Exam: BP 115/74   Pulse (!) 58   Ht 5\' 10"  (1.778 m)   Wt 148 lb (67.1 kg)   BMI 21.24 kg/m   Constitutional:  Alert and oriented, No acute distress. HEENT: [place] AT, moist mucus membranes.  Trachea midline, no masses. Cardiovascular: No clubbing, cyanosis, or edema. Respiratory: Normal respiratory effort, no increased work of breathing. GI: Abdomen is soft, nontender, nondistended, no abdominal masses GU: No CVA tenderness Rectal: Normal sphincter tone.  40 g prostate, stable left mid/ apical lateral nodule less than 1 cm, unchanged. Skin: No rashes, bruises or suspicious lesions. Neurologic: Grossly intact, no focal deficits, moving all 4 extremities. Psychiatric: Normal mood and affect.  Laboratory Data: Lab Results  Component Value Date   WBC 5.7 08/30/2017   HGB 13.4 08/30/2017   HCT 39.7 (L) 08/30/2017   MCV 90.8 08/30/2017   PLT 235 08/30/2017    Lab Results  Component Value Date   CREATININE 0.88 08/30/2017   PSA as above  Pertinent  Imaging: Results for orders placed or performed in visit on 10/06/19  Bladder Scan (Post Void Residual) in office  Result Value Ref Range   Scan Result 29ml     Assessment & Plan:    1. Benign prostatic hyperplasia with urinary frequency Gradual worsening of urinary symptoms, relatively asymptomatic last year Reports that he had his urine checked without evidence of infection We will recheck next visit Given his history of type bladder neck s/p TUIBN, would recommend cystoscopy to assure that he has not had recurrence of the strictured area He does have a personal history of stroke and neurologic condition, may be related to this as well, will hold off on treatment until we have ruled out the above He is agreeable with cystoscopy for further evaluation - Bladder Scan (Post Void Residual) in office  2. Family history of prostate cancer Status post previous biopsy which was negative PSA rectal exam remained stable today which is reassuring  3. Prostate nodule As above, likely BPH nodule   Return for MD follow up with cysto.  Hollice Espy, MD  [facility] [address] [place], [place] [address] [phone]

## 2019-10-14 ENCOUNTER — Other Ambulatory Visit: Payer: Medicaid Other | Admitting: Urology

## 2019-11-10 ENCOUNTER — Other Ambulatory Visit: Payer: Medicaid Other | Admitting: Urology

## 2019-11-17 ENCOUNTER — Other Ambulatory Visit: Payer: Self-pay

## 2019-11-17 ENCOUNTER — Ambulatory Visit (INDEPENDENT_AMBULATORY_CARE_PROVIDER_SITE_OTHER): Payer: Medicaid Other | Admitting: Urology

## 2019-11-17 ENCOUNTER — Encounter: Payer: Self-pay | Admitting: Urology

## 2019-11-17 VITALS — BP 114/64 | HR 55 | Ht 70.0 in | Wt 164.0 lb

## 2019-11-17 DIAGNOSIS — R3915 Urgency of urination: Secondary | ICD-10-CM

## 2019-11-17 DIAGNOSIS — R350 Frequency of micturition: Secondary | ICD-10-CM | POA: Diagnosis not present

## 2019-11-17 DIAGNOSIS — N401 Enlarged prostate with lower urinary tract symptoms: Secondary | ICD-10-CM

## 2019-11-17 MED ORDER — OXYBUTYNIN CHLORIDE ER 15 MG PO TB24: 15.0000 mg | ORAL_TABLET | Freq: Every day | ORAL | 11 refills | Status: DC

## 2019-11-17 NOTE — Progress Notes (Signed)
   11/30/19  CC:  Chief Complaint  Patient presents with  . Cysto    HPI: 63 year old male with a personal history of bladder neck contracture status post TUR BN in 2017 with worsening urgency/frequency symptoms.  He does have a personal history of stroke and neurologic condition.  He is not currently on any BPH medications.  Please see previous notes for details.  Blood pressure 114/64, pulse (!) 55, height 5\' 10"  (1.778 m), weight 164 lb (74.4 kg). NED. A&Ox3.   No respiratory distress   Abd soft, NT, ND Normal phallus with bilateral descended testicles  Cystoscopy Procedure Note  Patient identification was confirmed, informed consent was obtained, and patient was prepped using Betadine solution.  Lidocaine jelly was administered per urethral meatus.     Pre-Procedure: - Inspection reveals a normal caliber ureteral meatus.  Procedure: The flexible cystoscope was introduced without difficulty - No urethral strictures/lesions are present. - Normal prostate very minimal bilobar coaptation - Normal bladder neck which appears to be fairly open with some slight scarring in the midline, otherwise patent - Bilateral ureteral orifices identified - Bladder mucosa  reveals no ulcers, tumors, or lesions - No bladder stones -Mild trabeculation  Retroflexion shows normal bladder neck without any significant intraprostatic protrusion   Post-Procedure: - Patient tolerated the procedure well  Assessment/ Plan:  1. Benign prostatic hyperplasia with urinary frequency Slightly enlarged prostate, 42 g on prostate biopsy 2019  Symptoms primarily consistent with irritative voiding symptoms including frequency and urgency  Cystoscopy today with a widely patent bladder neck, no evidence of bladder neck contracture or recurrence of his stricture which is reassuring  Suspect underlying bladder instability bladder instability-we will treat with oxybutynin 15 mg daily and reassess his  urgency frequency type symptoms He is agreeable this plan  - Urinalysis, Complete  2. Urinary frequency As above  3. Urgency of urination As above   Return in about 6 weeks (around 12/29/2019) for PA for IPSS/ PVR.  Hollice Espy, MD

## 2019-11-18 LAB — MICROSCOPIC EXAMINATION
Bacteria, UA: NONE SEEN
Epithelial Cells (non renal): NONE SEEN /hpf (ref 0–10)
WBC, UA: NONE SEEN /hpf (ref 0–5)

## 2019-11-18 LAB — URINALYSIS, COMPLETE
Bilirubin, UA: NEGATIVE
Glucose, UA: NEGATIVE
Ketones, UA: NEGATIVE
Leukocytes,UA: NEGATIVE
Nitrite, UA: NEGATIVE
Protein,UA: NEGATIVE
RBC, UA: NEGATIVE
Specific Gravity, UA: 1.01 (ref 1.005–1.030)
Urobilinogen, Ur: 0.2 mg/dL (ref 0.2–1.0)
pH, UA: 6 (ref 5.0–7.5)

## 2020-01-04 NOTE — Progress Notes (Deleted)
01/05/2020 9:33 PM   Kenneth Daniels 1956/11/02 [id number]  Referring provider: Theotis Burrow, MD [address] [place],  [place] [address]  No chief complaint on file.   HPI: 64 year old male with a personal history of neck obstruction, elevated PSA returns today for routine annual follow-up.  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  He is no longer any BPH medications and continues to have minimal to no urinary symptoms.   More recently, he was noted to have a nodule the left base of the prostate in 2019.  He underwent prostate biopsy 08/2018 due to the presence of the nodule and a strong family history of advanced metastatic prostate cancer.  Prostate biopsy was benign.  TRUS vol 42 g.    PSA today 1.4 on 09/14/2019.  Cysto 11/2019 with Dr. Erlene Quan cystoscopy today with a widely patent bladder neck, no evidence of bladder neck contracture or recurrence of his stricture which is reassuring  He was started on oxybutynin.    He reports today that he has had increased urinary symptoms which primarily include weak stream frequency and urgency.  He gets up 2 times at night to void.  This is become only mildly bothersome to him.  He is worried that this is related to his underlying neurological condition only following up with his neurosurgeon/neurologist.  He reports that he had his urine checked by his primary care several times and did not have a urinary tract infection.  He is unable to provide a UA today.  No dysuria or gross hematuria.    Score:  1-7 Mild 8-19 Moderate 20-35 Severe   PMH: Past Medical History:  Diagnosis Date  . 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 Anxiety   . Atypical chest pain 04/17/2016  . CAD in native artery 04/17/2016  . Cerebral infarction ([place]) 11/25/2013   Overview:  IMO Problem List Replacer Jan. 2016   . Cervical spinal stenosis 07/06/2015  . Essential (primary) hypertension 04/15/2008  . Generalized convulsive epilepsy  ([place]) 02/20/2008  . Headache, migraine 11/25/2013  . Heart murmur   . Hepatitis    Hep "C"  . HLD (hyperlipidemia) 03/27/2012  . Hyperlipidemia   . Idiopathic localized osteoarthropathy 02/26/2011  . Major depressive disorder with single episode 11/14/2009  . Myocardial infarction ([place]) 2009  . 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 Seizures ([place])    Last Seizure 2013  . Stroke [facility])     Surgical History: Past Surgical History:  Procedure Laterality Date  . ABDOMINAL EXPLORATION SURGERY  2010?  Marland Kitchen BACK SURGERY    . CERVICAL DISC ARTHROPLASTY N/A 09/11/2017   Procedure: CERVICAL ANTERIOR DISC ARTHROPLASTY C3-4 POSSIBLE C4-5;  Surgeon: Meade Maw, MD;  Location: [facility] ORS;  Service: Neurosurgery;  Laterality: N/A;  . COLON SURGERY     Colon Resection  . HERNIA REPAIR    . HOLMIUM LASER APPLICATION N/A [id number]   Procedure: HOLMIUM LASER APPLICATION;  Surgeon: Hollice Espy, MD;  Location: [facility] ORS;  Service: Urology;  Laterality: N/A;  . PROSTATE ABLATION N/A 11/20/2016   Procedure: PROSTATE ABLATION;  Surgeon: Hollice Espy, MD;  Location: [facility] ORS;  Service: Urology;  Laterality: N/A;  . SOFT TISSUE TUMOR RESECTION     left thigh  . TRANSURETHRAL RESECTION OF PROSTATE N/A 11/20/2016   Procedure: TRANSURETHRAL RESECTION OF THE PROSTATE (TURP);  Surgeon: Hollice Espy, MD;  Location: [facility] ORS;  Service: Urology;  Laterality: N/A;    Home Medications:  Allergies as of 01/05/2020  Reactions   Gabapentin Other (See Comments)   Sulfa Antibiotics Rash      Medication List       Accurate as of January 04, 2020  9:33 PM. If you have any questions, ask your nurse or doctor.        aspirin EC 81 MG tablet Take 1 tablet (81 mg total) by mouth daily.   cholecalciferol 1000 units tablet Commonly known as: VITAMIN D Take 1,000 Units by mouth daily.   Vitamin D-3 125 MCG (5000 UT) Tabs Take 5,000 Units by mouth daily.   citalopram 40 MG tablet Commonly known as:  CELEXA TAKE 1 TABLET DAILY BY MOUTH   gabapentin 600 MG tablet Commonly known as: NEURONTIN Take by mouth.   lisinopril 10 MG tablet Commonly known as: ZESTRIL TAKE 1 TABLET BY MOUTH DAILY FOR HIGH BP   oxybutynin 15 MG 24 hr tablet Commonly known as: DITROPAN XL Take 1 tablet (15 mg total) by mouth daily.   phenytoin 100 MG ER capsule Commonly known as: DILANTIN TAKE 3 CAPSULES IN THE MORNING AND 2 CAPSULES EVERY EVENING   pregabalin 75 MG capsule Commonly known as: LYRICA Take by mouth.   simvastatin 20 MG tablet Commonly known as: ZOCOR TAKE 1 TABLET EVERY EVENING FOR HIGH CHOLESTEROL       Allergies:  Allergies  Allergen Reactions  . Gabapentin Other (See Comments)  . Sulfa Antibiotics Rash    Family History: Family History  Problem Relation Age of Onset  . Bladder Cancer Neg Hx   . Prostate cancer Neg Hx   . Kidney cancer Neg Hx     Social History:  reports that he quit smoking about 12 years ago. His smoking use included cigarettes. He has never used smokeless tobacco. He reports current drug use. Drug: Marijuana. He reports that he does not drink alcohol.  ROS:                                        Physical Exam: There were no vitals taken for this visit.  Constitutional:  Well nourished. Alert and oriented, No acute distress. HEENT: [place] AT, moist mucus membranes.  Trachea midline, no masses. Cardiovascular: No clubbing, cyanosis, or edema. Respiratory: Normal respiratory effort, no increased work of breathing. GI: Abdomen is soft, non tender, non distended, no abdominal masses. Liver and spleen not palpable.  No hernias appreciated.  Stool sample for occult testing is not indicated.   GU: No CVA tenderness.  No bladder fullness or masses.  Patient with circumcised/uncircumcised phallus. ***Foreskin easily retracted***  Urethral meatus is patent.  No penile discharge. No penile lesions or rashes. Scrotum without lesions, cysts,  rashes and/or edema.  Testicles are located scrotally bilaterally. No masses are appreciated in the testicles. Left and right epididymis are normal. Rectal: Patient with  normal sphincter tone. Anus and perineum without scarring or rashes. No rectal masses are appreciated. Prostate is approximately *** grams, *** nodules are appreciated. Seminal vesicles are normal. Skin: No rashes, bruises or suspicious lesions. Lymph: No cervical or inguinal adenopathy. Neurologic: Grossly intact, no focal deficits, moving all 4 extremities. Psychiatric: Normal mood and affect.  Laboratory Data: Lab Results  Component Value Date   WBC 5.7 08/30/2017   HGB 13.4 08/30/2017   HCT 39.7 (L) 08/30/2017   MCV 90.8 08/30/2017   PLT 235 08/30/2017    Lab Results  Component Value  Date   CREATININE 0.88 08/30/2017   PSA as above  Results for orders placed or performed in visit on 11/17/19  Microscopic Examination   URINE  Result Value Ref Range   WBC, UA None seen 0 - 5 /hpf   RBC 0-2 0 - 2 /hpf   Epithelial Cells (non renal) None seen 0 - 10 /hpf   Bacteria, UA None seen None seen/Few  Urinalysis, Complete  Result Value Ref Range   Specific Gravity, UA 1.010 1.005 - 1.030   pH, UA 6.0 5.0 - 7.5   Color, UA Yellow Yellow   Appearance Ur Clear Clear   Leukocytes,UA Negative Negative   Protein,UA Negative Negative/Trace   Glucose, UA Negative Negative   Ketones, UA Negative Negative   RBC, UA Negative Negative   Bilirubin, UA Negative Negative   Urobilinogen, Ur 0.2 0.2 - 1.0 mg/dL   Nitrite, UA Negative Negative   Microscopic Examination See below:   I have reviewed the labs.  Pertinent imaging ***  Assessment & Plan:    1. Benign prostatic hyperplasia with urinary frequency Gradual worsening of urinary symptoms, relatively asymptomatic last year Reports that he had his urine checked without evidence of infection We will recheck next visit Given his history of type bladder neck s/p  TUIBN, would recommend cystoscopy to assure that he has not had recurrence of the strictured area He does have a personal history of stroke and neurologic condition, may be related to this as well, will hold off on treatment until we have ruled out the above He is agreeable with cystoscopy for further evaluation - Bladder Scan (Post Void Residual) in office  2. Family history of prostate cancer Status post previous biopsy which was negative PSA rectal exam remained stable today which is reassuring  3. Prostate nodule As above, likely BPH nodule   No follow-ups on file.  Zara Council, PA-C  [facility] [address] [place], [place] [address] [phone]

## 2020-01-05 ENCOUNTER — Encounter: Payer: Self-pay | Admitting: Urology

## 2020-01-05 ENCOUNTER — Ambulatory Visit: Payer: Medicaid Other | Admitting: Urology

## 2020-01-18 NOTE — Progress Notes (Signed)
01/19/2020 9:13 AM   Kenneth Daniels 05/11/1956 [id number]  Referring provider: Theotis Burrow, MD [address] [place],  [place] [address]  Chief Complaint  Patient presents with  . Benign Prostatic Hypertrophy    HPI: 64 year old male with a personal history of bladder neck obstruction, prostate nodule and BPH with LUTS returns today for routine annual follow-up.  History of bladder neck obstruction He is s/p TULIP 10/2016 for bladder neck obstruction.  Cysto 11/2019 with Dr. Erlene Quan revealed a widely patent bladder neck, no evidence of bladder neck contracture or recurrence of his stricture.  PVR today is 40 mL.    Prostate nodule Noted to have a nodule the left base of the prostate in 2019.  He underwent prostate biopsy 08/2018 due to the presence of the nodule and a strong family history of advanced metastatic prostate cancer.  Prostate biopsy was benign.  TRUS vol 42 g.  PSA 1.4 on 09/14/2019.  Exam by Dr. Erlene Quan in 10/2019 stable.  BPH WITH LUTS  (prostate and/or bladder) IPSS score: 11/2   PVR: 40 mL     Major complaint(s):  Nocturia and hesitancy x for over three years. Denies any dysuria, hematuria or suprapubic pain.   Currently taking: oxybutynin XL 15 mg daily   Denies any recent fevers, chills, nausea or vomiting.   IPSS    Row Name 01/19/20 1500         International Prostate Symptom Score   How often have you had the sensation of not emptying your bladder?  About half the time     How often have you had to urinate less than every two hours?  Less than half the time     How often have you found you stopped and started again several times when you urinated?  Less than half the time     How often have you found it difficult to postpone urination?  Less than half the time     How often have you had a weak urinary stream?  Not at All     How often have you had to strain to start urination?  Not at All     How many times did you typically get  up at night to urinate?  2 Times     Total IPSS Score  11       Quality of Life due to urinary symptoms   If you were to spend the rest of your life with your urinary condition just the way it is now how would you feel about that?  Mostly Satisfied        Score:  1-7 Mild 8-19 Moderate 20-35 Severe  PMH: Past Medical History:  Diagnosis Date  . 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 Anxiety   . Atypical chest pain 04/17/2016  . CAD in native artery 04/17/2016  . Cerebral infarction ([place]) 11/25/2013   Overview:  IMO Problem List Replacer Jan. 2016   . Cervical spinal stenosis 07/06/2015  . Essential (primary) hypertension 04/15/2008  . 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 Headache, migraine 11/25/2013  . Heart murmur   . Hepatitis    Hep "C"  . HLD (hyperlipidemia) 03/27/2012  . Hyperlipidemia   . Idiopathic localized osteoarthropathy 02/26/2011  . Major depressive disorder with single episode 11/14/2009  . Myocardial infarction ([place]) 2009  . 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 Seizures ([place])    Last Seizure 2013  . Stroke [facility])     Surgical  History: Past Surgical History:  Procedure Laterality Date  . ABDOMINAL EXPLORATION SURGERY  2010?  Marland Kitchen BACK SURGERY    . CERVICAL DISC ARTHROPLASTY N/A 09/11/2017   Procedure: CERVICAL ANTERIOR DISC ARTHROPLASTY C3-4 POSSIBLE C4-5;  Surgeon: Meade Maw, MD;  Location: [facility] ORS;  Service: Neurosurgery;  Laterality: N/A;  . COLON SURGERY     Colon Resection  . HERNIA REPAIR    . HOLMIUM LASER APPLICATION N/A [id number]   Procedure: HOLMIUM LASER APPLICATION;  Surgeon: Hollice Espy, MD;  Location: [facility] ORS;  Service: Urology;  Laterality: N/A;  . PROSTATE ABLATION N/A 11/20/2016   Procedure: PROSTATE ABLATION;  Surgeon: Hollice Espy, MD;  Location: [facility] ORS;  Service: Urology;  Laterality: N/A;  . SOFT TISSUE TUMOR RESECTION     left thigh  . TRANSURETHRAL RESECTION OF PROSTATE N/A 11/20/2016   Procedure: TRANSURETHRAL  RESECTION OF THE PROSTATE (TURP);  Surgeon: Hollice Espy, MD;  Location: [facility] ORS;  Service: Urology;  Laterality: N/A;    Home Medications:  Allergies as of 01/19/2020      Reactions   Gabapentin Other (See Comments)   Sulfa Antibiotics Rash      Medication List       Accurate as of January 19, 2020 11:59 PM. If you have any questions, ask your nurse or doctor.        aspirin EC 81 MG tablet Take 1 tablet (81 mg total) by mouth daily.   cholecalciferol 1000 units tablet Commonly known as: VITAMIN D Take 1,000 Units by mouth daily.   Vitamin D-3 125 MCG (5000 UT) Tabs Take 5,000 Units by mouth daily.   citalopram 40 MG tablet Commonly known as: CELEXA TAKE 1 TABLET DAILY BY MOUTH   gabapentin 600 MG tablet Commonly known as: NEURONTIN Take by mouth.   lisinopril 10 MG tablet Commonly known as: ZESTRIL TAKE 1 TABLET BY MOUTH DAILY FOR HIGH BP   oxybutynin 15 MG 24 hr tablet Commonly known as: DITROPAN XL Take 1 tablet (15 mg total) by mouth daily.   phenytoin 100 MG ER capsule Commonly known as: DILANTIN TAKE 3 CAPSULES IN THE MORNING AND 2 CAPSULES EVERY EVENING   pregabalin 75 MG capsule Commonly known as: LYRICA Take by mouth.   simvastatin 20 MG tablet Commonly known as: ZOCOR TAKE 1 TABLET EVERY EVENING FOR HIGH CHOLESTEROL       Allergies:  Allergies  Allergen Reactions  . Gabapentin Other (See Comments)  . Sulfa Antibiotics Rash    Family History: Family History  Problem Relation Age of Onset  . Bladder Cancer Neg Hx   . Prostate cancer Neg Hx   . Kidney cancer Neg Hx     Social History:  reports that he quit smoking about 12 years ago. His smoking use included cigarettes. He has never used smokeless tobacco. He reports current drug use. Drug: Marijuana. He reports that he does not drink alcohol.  ROS: UROLOGY Frequent Urination?: No Hard to postpone urination?: No Burning/pain with urination?: No Get up at night to urinate?:  Yes Leakage of urine?: No Urine stream starts and stops?: No Trouble starting stream?: Yes Do you have to strain to urinate?: No Blood in urine?: No Urinary tract infection?: No Sexually transmitted disease?: No Injury to kidneys or bladder?: No Painful intercourse?: No Weak stream?: No Erection problems?: No Penile pain?: No  Gastrointestinal Nausea?: No Vomiting?: No Indigestion/heartburn?: No Diarrhea?: No Constipation?: No  Constitutional Fever: No Night sweats?: No Weight loss?: No Fatigue?: No  Skin Skin rash/lesions?: No Itching?: No  Eyes Blurred vision?: Yes Double vision?: No  Ears/Nose/Throat Sore throat?: No Sinus problems?: No  Hematologic/Lymphatic Swollen glands?: No Easy bruising?: No  Cardiovascular Leg swelling?: No Chest pain?: No  Respiratory Cough?: No Shortness of breath?: No  Endocrine Excessive thirst?: No  Musculoskeletal Back pain?: No Joint pain?: No  Neurological Headaches?: Yes Dizziness?: No  Psychologic Depression?: No Anxiety?: No  Physical Exam: BP 108/74   Pulse 71   Ht 5\' 10"  (1.778 m)   Wt 147 lb (66.7 kg)   BMI 21.09 kg/m   Constitutional:  Well nourished. Alert and oriented, No acute distress. HEENT: [place] AT, mask in place  Trachea midline, no masses. Cardiovascular: No clubbing, cyanosis, or edema. Respiratory: Normal respiratory effort, no increased work of breathing. Neurologic: Grossly intact, no focal deficits, moving all 4 extremities. Psychiatric: Normal mood and affect.  Laboratory Data: Lab Results  Component Value Date   WBC 5.7 08/30/2017   HGB 13.4 08/30/2017   HCT 39.7 (L) 08/30/2017   MCV 90.8 08/30/2017   PLT 235 08/30/2017    Lab Results  Component Value Date   CREATININE 0.88 08/30/2017   PSA as above I have reviewed the labs.  Pertinent imaging Results for JAYDN, SMELLEY (MRN [id number]) as of 01/19/2020 16:06  Ref. Range 01/19/2020 15:39  Scan Result Unknown 40     Assessment & Plan:    1.  BPH with LUTS IPSS score is 11/2 Continue conservative management, avoiding bladder irritants and timed voiding's At goal with oxybutynin Continue oxybutynin XL 15 mg daily -changed script to 90 days with refills RTC in September 2021 for PSA, I PSS, PVR and exam  2. Family history of prostate cancer Status post previous biopsy which was negative RTC in September 2021 for PSA and exam  3. Prostate nodule As above, likely BPH nodule   Return in about 8 months (around 09/05/2020) for PSA prior and Ipss, pvr, exam .  Zara Council, [facility]  [place] [address], [place], [place] [address] [phone]

## 2020-01-19 ENCOUNTER — Other Ambulatory Visit: Payer: Self-pay

## 2020-01-19 ENCOUNTER — Encounter: Payer: Self-pay | Admitting: Urology

## 2020-01-19 ENCOUNTER — Ambulatory Visit (INDEPENDENT_AMBULATORY_CARE_PROVIDER_SITE_OTHER): Payer: Medicaid Other | Admitting: Urology

## 2020-01-19 VITALS — BP 108/74 | HR 71 | Ht 70.0 in | Wt 147.0 lb

## 2020-01-19 DIAGNOSIS — R350 Frequency of micturition: Secondary | ICD-10-CM

## 2020-01-19 DIAGNOSIS — R3915 Urgency of urination: Secondary | ICD-10-CM

## 2020-01-19 DIAGNOSIS — N401 Enlarged prostate with lower urinary tract symptoms: Secondary | ICD-10-CM

## 2020-01-19 DIAGNOSIS — N138 Other obstructive and reflux uropathy: Secondary | ICD-10-CM | POA: Diagnosis not present

## 2020-01-19 LAB — BLADDER SCAN AMB NON-IMAGING: Scan Result: 40

## 2020-01-19 MED ORDER — OXYBUTYNIN CHLORIDE ER 15 MG PO TB24: 15.0000 mg | ORAL_TABLET | Freq: Every day | ORAL | 3 refills | Status: DC

## 2020-05-03 ENCOUNTER — Ambulatory Visit (INDEPENDENT_AMBULATORY_CARE_PROVIDER_SITE_OTHER): Payer: Medicaid Other | Admitting: Family

## 2020-05-03 ENCOUNTER — Other Ambulatory Visit: Payer: Self-pay

## 2020-05-03 ENCOUNTER — Encounter: Payer: Self-pay | Admitting: Family

## 2020-05-03 VITALS — BP 110/80 | HR 55 | Ht 70.0 in | Wt 139.2 lb

## 2020-05-03 DIAGNOSIS — E782 Mixed hyperlipidemia: Secondary | ICD-10-CM

## 2020-05-03 DIAGNOSIS — R0789 Other chest pain: Secondary | ICD-10-CM

## 2020-05-03 DIAGNOSIS — M542 Cervicalgia: Secondary | ICD-10-CM

## 2020-05-03 DIAGNOSIS — I10 Essential (primary) hypertension: Secondary | ICD-10-CM

## 2020-05-03 DIAGNOSIS — I6523 Occlusion and stenosis of bilateral carotid arteries: Secondary | ICD-10-CM | POA: Diagnosis not present

## 2020-05-03 NOTE — Progress Notes (Signed)
Office Visit    Patient Name: Kenneth Daniels Date of Encounter: 05/03/2020  Primary Care Provider:  Theotis Burrow, MD Primary Cardiologist:  Ida Rogue, MD Electrophysiologist:  None   Chief Complaint    Kenneth Daniels is a 64 y.o. male with a hx of tobacco use, carotid artery disease, epilepsy, hepatitis c, CAD presents today for chest pain   Past Medical History    Past Medical History:  Diagnosis Date  . 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 Anxiety   . Atypical chest pain 04/17/2016  . CAD in native artery 04/17/2016  . Cerebral infarction ([place]) 11/25/2013   Overview:  IMO Problem List Replacer Jan. 2016   . Cervical spinal stenosis 07/06/2015  . Essential (primary) hypertension 04/15/2008  . 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 Headache, migraine 11/25/2013  . Heart murmur   . Hepatitis    Hep "C"  . HLD (hyperlipidemia) 03/27/2012  . Hyperlipidemia   . Idiopathic localized osteoarthropathy 02/26/2011  . Major depressive disorder with single episode 11/14/2009  . Myocardial infarction ([place]) 2009  . 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 Seizures ([place])    Last Seizure 2013  . Stroke [facility])    Past Surgical History:  Procedure Laterality Date  . ABDOMINAL EXPLORATION SURGERY  2010?  Marland Kitchen BACK SURGERY    . CERVICAL DISC ARTHROPLASTY N/A 09/11/2017   Procedure: CERVICAL ANTERIOR DISC ARTHROPLASTY C3-4 POSSIBLE C4-5;  Surgeon: Meade Maw, MD;  Location: [facility] ORS;  Service: Neurosurgery;  Laterality: N/A;  . COLON SURGERY     Colon Resection  . HERNIA REPAIR    . HOLMIUM LASER APPLICATION N/A [id number]   Procedure: HOLMIUM LASER APPLICATION;  Surgeon: Hollice Espy, MD;  Location: [facility] ORS;  Service: Urology;  Laterality: N/A;  . PROSTATE ABLATION N/A 11/20/2016   Procedure: PROSTATE ABLATION;  Surgeon: Hollice Espy, MD;  Location: [facility] ORS;  Service: Urology;  Laterality: N/A;  . SOFT TISSUE TUMOR RESECTION     left thigh  .  TRANSURETHRAL RESECTION OF PROSTATE N/A 11/20/2016   Procedure: TRANSURETHRAL RESECTION OF THE PROSTATE (TURP);  Surgeon: Hollice Espy, MD;  Location: [facility] ORS;  Service: Urology;  Laterality: N/A;    Allergies  Allergies  Allergen Reactions  . Gabapentin Other (See Comments)  . Lisinopril   . Sulfa Antibiotics Rash    History of Present Illness    Kenneth Daniels is a 64 y.o. male with a hx of tobacco use, carotid artery disease, epilepsy, hepatitis c, CAD.  He was last seen 04/06/2019 by Dr. Rockey Situ via telemedicine.  Had an MI many years ago at [facility] with records unavailable. No prior cardiac catheterization per previous notes. 2018 stress test was low risk. Carotid duplex 04/2019 bilateral 1-39% stenosis recommended for repeat study as-needed.  Seen by PCP 04/20/2019 with midsternal pain that radiated into his abdomen for 3 weeks.  Was improved with Tums.  His pain was thought to be musculoskeletal at that visit recommended for 1 compress and Tylenol.  He was sent to our office for follow-up.  Labs independently reviewed from outside provider 01/19/2020: Creatinine 0.81, GFR 109, K4.8  Presents today feeling well.  When asked about chest pain he cannot recall having any.  When asked about abdominal pain he recalls having pain just above his abdomen.  This has since resolved with the addition of over-the-counter Pepcid.  We discussed that the likely etiology was GERD.  Reports no shortness of breath nor dyspnea on exertion. Reports  no chest pain, pressure, or tightness. No edema, orthopnea, PND. Reports no palpitations.   Reports pain down his neck, back, and down his left arm and his whole left side. Has been ongoing for 1 year. Tells me it just now got to the point that it is bothering him.  Tells me it "feels like pain". Follows with neurosurgery regarding this. Is hopeful to avoid surgery.   EKGs/Labs/Other Studies Reviewed:   The following studies were reviewed today:  EKG:  EKG  is  ordered today.  The ekg ordered today demonstrates SB 55 bpm with 1st degree AV block (PR 220) and no acute ST/T wave changes. Minimal voltage criteria for LVH, likely normal variant.   Recent Labs: No results found for requested labs within last 8760 hours.  Recent Lipid Panel No results found for: CHOL, TRIG, HDL, CHOLHDL, VLDL, LDLCALC, LDLDIRECT  Home Medications   Current Meds  Medication Sig  . aspirin EC 81 MG tablet Take 1 tablet (81 mg total) by mouth daily.  . cholecalciferol (VITAMIN D) 1000 units tablet Take 1,000 Units by mouth daily.  . Cholecalciferol (VITAMIN D-3) 5000 units TABS Take 5,000 Units by mouth daily.  . citalopram (CELEXA) 40 MG tablet TAKE 1 TABLET DAILY BY MOUTH  . oxybutynin (DITROPAN XL) 15 MG 24 hr tablet Take 1 tablet (15 mg total) by mouth daily.  . phenytoin (DILANTIN) 100 MG ER capsule TAKE 3 CAPSULES IN THE MORNING AND 2 CAPSULES EVERY EVENING  . pregabalin (LYRICA) 75 MG capsule Take by mouth.  . simvastatin (ZOCOR) 20 MG tablet TAKE 1 TABLET EVERY EVENING FOR HIGH CHOLESTEROL     Review of Systems   Review of Systems  Constitution: Negative for chills, fever and malaise/fatigue.  Cardiovascular: Negative for chest pain, dyspnea on exertion, leg swelling, near-syncope, orthopnea, palpitations and syncope.  Respiratory: Negative for cough, shortness of breath and wheezing.   Musculoskeletal: Positive for back pain.  Gastrointestinal: Negative for nausea and vomiting.  Neurological: Negative for dizziness, light-headedness and weakness.   All other systems reviewed and are otherwise negative except as noted above.  Physical Exam    VS:  BP 110/80 (BP Location: Left Arm, Patient Position: Sitting, Cuff Size: Normal)   Pulse (!) 55   Ht 5\' 10"  (1.778 m)   Wt 139 lb 4 oz (63.2 kg)   SpO2 98%   BMI 19.98 kg/m  , BMI Body mass index is 19.98 kg/m. GEN: Well nourished, well developed, in no acute distress. HEENT: normal. Neck: Supple, no  JVD, carotid bruits, or masses. Cardiac: RRR, no murmurs, rubs, or gallops. No clubbing, cyanosis, edema.  Radials/DP/PT 2+ and equal bilaterally.  Respiratory:  Respirations regular and unlabored, clear to auscultation bilaterally. GI: Soft, nontender, nondistended, BS + x 4. MS: No deformity or atrophy. Skin: Warm and dry, no rash. Neuro:  Strength and sensation are intact. Psych: Normal affect.  Accessory Clinical Findings    ECG personally reviewed by me today - SB 55bpm with first degree AV block and no acute ST/T wave changes - no acute changes.  Assessment & Plan    1. Atypical chest pain -visit with his PCP a few weeks ago with 3 weeks of "chest pain " -arm discussion it was chest pain directly above the abdomen that was relieved by Tums and subsequently completely resolved by Pepcid.  Anticipate etiology was GERD.  Continue to use Pepcid and Tums as needed.  EKG today no acute changes.  Reports no exertional dyspnea  nor chest pain.  No indication for ischemic evaluation at this time as symptoms have resolved.  He was offered [employer] if he did not feel reassured by our discussion but he agrees that additional testing is not necessary.  Continue aspirin and statin.  2. Carotid artery disease -04/2019 carotid duplex with bilateral 1 to 39% stenosis.  Continue statin and aspirin.  Follow-up study as needed.  3. HLD-follows with PCP.  No recent lipid profile available for review.  Continue simvastatin 20 mg daily.  4. HTN -BP well controlled.  Continue present antihypertensive regimen including HCTZ 25mg  daily by PCP.  5. Neck/back/arm pain - Continue to follow with neurosurgery regarding ulnar neuropathy and cervical myelopathy. He is hopeful to avoid surgery. His arm pain is not cardiac in nature.   Disposition: Follow up in 1 year(s) with Dr. Rockey Situ or APP   Loel Dubonnet, NP 05/03/2020, 12:53 PM

## 2020-05-03 NOTE — Patient Instructions (Addendum)
Medication Instructions:  No medication changes today.   *If you need a refill on your cardiac medications before your next appointment, please call your pharmacy*  Lab Work: No lab work today.   Testing/Procedures: Your EKG today showed sinus bradycardia which is a stable but slow heart rhythm. No signs of blockages.   Follow-Up: At [facility], you and your health needs are our priority.  As part of our continuing mission to provide you with exceptional heart care, we have created designated Provider Care Teams.  These Care Teams include your primary Cardiologist (physician) and Advanced Practice Providers (APPs -  Physician Assistants and Nurse Practitioners) who all work together to provide you with the care you need, when you need it.  We recommend signing up for the patient portal called "MyChart".  Sign up information is provided on this After Visit Summary.  MyChart is used to connect with patients for Virtual Visits (Telemedicine).  Patients are able to view lab/test results, encounter notes, upcoming appointments, etc.  Non-urgent messages can be sent to your provider as well.   To learn more about what you can do with MyChart, go to [email or web address].    Your next appointment:   1 year(s)  The format for your next appointment:   In Person  Provider:   You may see Ida Rogue, MD or one of the following Advanced Practice Providers on your designated Care Team:    Murray Hodgkins, NP  Christell Faith, PA-C  Marrianne Mood, PA-C

## 2020-08-25 ENCOUNTER — Other Ambulatory Visit: Payer: Self-pay | Admitting: Family Medicine

## 2020-08-25 DIAGNOSIS — N138 Other obstructive and reflux uropathy: Secondary | ICD-10-CM

## 2020-08-30 ENCOUNTER — Other Ambulatory Visit: Payer: Medicaid Other

## 2020-08-30 ENCOUNTER — Encounter: Payer: Self-pay | Admitting: Urology

## 2020-09-06 ENCOUNTER — Ambulatory Visit: Payer: Self-pay | Admitting: Urology

## 2020-09-07 ENCOUNTER — Ambulatory Visit: Payer: Self-pay | Admitting: Urology

## 2020-09-07 ENCOUNTER — Encounter: Payer: Self-pay | Admitting: Urology

## 2020-10-10 ENCOUNTER — Telehealth: Payer: Self-pay | Admitting: Urology

## 2020-10-10 NOTE — Telephone Encounter (Signed)
Please call [name] and have him reschedule his missed appointment with me in September.  It is important that he follow up as he has a prostate nodule and we need to continue to monitor it closely as it is a risk factor for cancer.

## 2020-10-13 ENCOUNTER — Other Ambulatory Visit: Payer: Self-pay

## 2020-10-13 ENCOUNTER — Other Ambulatory Visit: Payer: Medicare HMO

## 2020-10-13 DIAGNOSIS — N401 Enlarged prostate with lower urinary tract symptoms: Secondary | ICD-10-CM

## 2020-10-13 DIAGNOSIS — N138 Other obstructive and reflux uropathy: Secondary | ICD-10-CM

## 2020-10-14 LAB — PSA: Prostate Specific Ag, Serum: 1.7 ng/mL (ref 0.0–4.0)

## 2020-10-18 ENCOUNTER — Telehealth: Payer: Self-pay

## 2020-10-18 NOTE — Telephone Encounter (Signed)
Contacted patient for lung CT screening clinic based on referral from [place]. [place].  Message left for patient to call Burgess Estelle, lung navigator to schedule CT scan.

## 2020-10-19 ENCOUNTER — Ambulatory Visit: Payer: Medicare Other | Admitting: Urology

## 2020-10-19 DIAGNOSIS — N402 Nodular prostate without lower urinary tract symptoms: Secondary | ICD-10-CM

## 2020-10-19 DIAGNOSIS — N138 Other obstructive and reflux uropathy: Secondary | ICD-10-CM

## 2020-10-19 NOTE — Progress Notes (Signed)
10/20/2020 1:12 PM   Kenneth Daniels 05/15/1956 [id number]  Referring provider: Theotis Burrow, MD [address] [place],  [place] [address]  Chief Complaint  Patient presents with  . Benign Prostatic Hypertrophy    HPI: 64 year old male with a personal history of bladder neck obstruction, prostate nodule and BPH with LUTS returns today for routine annual follow-up.  History of bladder neck obstruction He is s/p TULIP 10/2016 for bladder neck obstruction.  Cysto 11/2019 with Dr. Erlene Quan revealed a widely patent bladder neck, no evidence of bladder neck contracture or recurrence of his stricture.  PVR today is 0 mL.    Prostate nodule Noted to have a nodule the left base of the prostate in 2019.  He underwent prostate biopsy 08/2018 due to the presence of the nodule and a strong family history of advanced metastatic prostate cancer.  Prostate biopsy was benign.  TRUS vol 42 g.  PSA 1.4 on 09/14/2019.   Component     Latest Ref Rng & Units 04/18/2016 10/18/2016 07/12/2017 07/09/2018  Prostate Specific Ag, Serum     0.0 - 4.0 ng/mL 1.2 0.7 1.2 1.6   Component     Latest Ref Rng & Units 09/14/2019 10/13/2020  Prostate Specific Ag, Serum     0.0 - 4.0 ng/mL 1.4 1.7    BPH WITH LUTS  (prostate and/or bladder) I PSS score: 12/2     PVR: 0 mL    Previous IPSS score: 11/2   Previous PVR: 40 mL     Major complaint(s):  Nocturia and hesitancy x for over three years. Denies any dysuria, hematuria or suprapubic pain.   Currently taking: oxybutynin XL 15 mg daily   Denies any recent fevers, chills, nausea or vomiting.  Urine is benign.   IPSS    Row Name 10/20/20 1400         International Prostate Symptom Score   How often have you had the sensation of not emptying your bladder? Less than 1 in 5     How often have you had to urinate less than every two hours? Less than half the time     How often have you found you stopped and started again several times when you  urinated? Not at All     How often have you found it difficult to postpone urination? Less than half the time     How often have you had a weak urinary stream? More than half the time     How often have you had to strain to start urination? Less than 1 in 5 times     How many times did you typically get up at night to urinate? 2 Times     Total IPSS Score 12       Quality of Life due to urinary symptoms   If you were to spend the rest of your life with your urinary condition just the way it is now how would you feel about that? Mostly Satisfied            Score:  1-7 Mild 8-19 Moderate 20-35 Severe  PMH: Past Medical History:  Diagnosis Date  . 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 Anxiety   . Atypical chest pain 04/17/2016  . CAD in native artery 04/17/2016  . Cerebral infarction ([facility]) 11/25/2013   Overview:  IMO Problem List Replacer Jan. 2016   . Cervical spinal stenosis 07/06/2015  . Essential (primary) hypertension 04/15/2008  . 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 Headache, migraine 11/25/2013  . Heart murmur   . Hepatitis    Hep "C"  . HLD (hyperlipidemia) 03/27/2012  . Hyperlipidemia   . Idiopathic localized osteoarthropathy 02/26/2011  . Major depressive disorder with single episode 11/14/2009  . Myocardial infarction ([facility]) 2009  . 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 Seizures ([place])    Last Seizure 2013  . Stroke [facility])     Surgical History: Past Surgical History:  Procedure Laterality Date  . ABDOMINAL EXPLORATION SURGERY  2010?  [facility] Kitchen BACK SURGERY    . CERVICAL DISC ARTHROPLASTY N/A 09/11/2017   Procedure: CERVICAL ANTERIOR DISC ARTHROPLASTY C3-4 POSSIBLE C4-5;  Surgeon: Meade Maw, MD;  Location: [facility] ORS;  Service: Neurosurgery;  Laterality: N/A;  . COLON SURGERY     Colon Resection  . HERNIA REPAIR    . HOLMIUM LASER APPLICATION N/A 95/08/3266   Procedure: HOLMIUM LASER APPLICATION;  Surgeon: Hollice Espy, MD;  Location: [facility] ORS;  Service:  Urology;  Laterality: N/A;  . PROSTATE ABLATION N/A 11/20/2016   Procedure: PROSTATE ABLATION;  Surgeon: Hollice Espy, MD;  Location: [facility] ORS;  Service: Urology;  Laterality: N/A;  . SOFT TISSUE TUMOR RESECTION     left thigh  . TRANSURETHRAL RESECTION OF PROSTATE N/A 11/20/2016   Procedure: TRANSURETHRAL RESECTION OF THE PROSTATE (TURP);  Surgeon: Hollice Espy, MD;  Location: [facility] ORS;  Service: Urology;  Laterality: N/A;    Home Medications:  Allergies as of 10/20/2020      Reactions   Oxycodone Other (See Comments)   Other reaction(s): Confusion "Had me all messed up" "Had me all messed up"   Gabapentin Other (See Comments)   Lisinopril    Sulfa Antibiotics Rash      Medication List       Accurate as of October 20, 2020 11:59 PM. If you have any questions, ask your nurse or doctor.        aspirin EC 81 MG tablet Take 1 tablet (81 mg total) by mouth daily.   cholecalciferol 1000 units tablet Commonly known as: VITAMIN D Take 1,000 Units by mouth daily.   Vitamin D-3 125 MCG (5000 UT) Tabs Take 5,000 Units by mouth daily.   citalopram 20 MG tablet Commonly known as: CELEXA What changed: Another medication with the same name was removed. Continue taking this medication, and follow the directions you see here. Changed by: Zara Council, PA-C   hydrochlorothiazide 25 MG tablet Commonly known as: HYDRODIURIL Take 25 mg by mouth daily.   lisinopril 10 MG tablet Commonly known as: ZESTRIL TAKE 1 TABLET BY MOUTH DAILY FOR HIGH BP   oxybutynin 15 MG 24 hr tablet Commonly known as: DITROPAN XL Take 1 tablet (15 mg total) by mouth daily.   phenytoin 100 MG ER capsule Commonly known as: DILANTIN TAKE 3 CAPSULES IN THE MORNING AND 2 CAPSULES EVERY EVENING   phenytoin 50 MG tablet Commonly known as: DILANTIN   pregabalin 75 MG capsule Commonly known as: LYRICA Take by mouth.   simvastatin 20 MG tablet Commonly known as: ZOCOR TAKE 1 TABLET EVERY EVENING  FOR HIGH CHOLESTEROL       Allergies:  Allergies  Allergen Reactions  . Oxycodone Other (See Comments)    Other reaction(s): Confusion "Had me all messed up" "Had me all messed up"  . Gabapentin Other (See Comments)  . Lisinopril   . Sulfa Antibiotics Rash    Family History: Family History  Problem Relation Age of Onset  .  Bladder Cancer Neg Hx   . Prostate cancer Neg Hx   . Kidney cancer Neg Hx     Social History:  reports that he quit smoking about 12 years ago. His smoking use included cigarettes. He has a 28.50 pack-year smoking history. He has never used smokeless tobacco. He reports current drug use. Drug: Marijuana. He reports that he does not drink alcohol.  ROS: For pertinent review of systems please refer to history of present illness  Physical Exam: BP 140/80   Pulse 77   Ht 5\' 10"  (1.778 m)   Wt 138 lb (62.6 kg)   BMI 19.80 kg/m   Constitutional:  Well nourished. Alert and oriented, No acute distress. HEENT: [place] AT, mask in place.  Trachea midline Cardiovascular: No clubbing, cyanosis, or edema. Respiratory: Normal respiratory effort, no increased work of breathing. GU: No CVA tenderness.  No bladder fullness or masses.  Patient with uncircumcised phallus.  Foreskin easily retracted  Urethral meatus is patent.  No penile discharge. No penile lesions or rashes. Scrotum without lesions, cysts, rashes and/or edema.  Testicles are located scrotally bilaterally. No masses are appreciated in the testicles. Left and right epididymis are normal. Rectal: Patient with  normal sphincter tone. Anus and perineum without scarring or rashes. No rectal masses are appreciated. Prostate is approximately 40 grams, stable  mid/ apical nodule < 1 cm in size, unchanged.   Seminal vesicles could not be palpated Skin: No rashes, bruises or suspicious lesions. Lymph: No inguinal adenopathy. Neurologic: Grossly intact, no focal deficits, moving all 4 extremities. Psychiatric: Normal  mood and affect.  Laboratory Data: Urinalysis Component     Latest Ref Rng & Units 10/20/2020  Specific Gravity, UA     1.005 - 1.030 1.020  pH, UA     5.0 - 7.5 5.5  Color, UA     Yellow Yellow  Appearance Ur     Clear Clear  Leukocytes,UA     Negative Negative  Protein,UA     Negative/Trace Negative  Glucose, UA     Negative Negative  Ketones, UA     Negative Negative  RBC, UA     Negative Negative  Bilirubin, UA     Negative Negative  Urobilinogen, Ur     0.2 - 1.0 mg/dL 0.2  Nitrite, UA     Negative Negative  Microscopic Examination      See below:   Component     Latest Ref Rng & Units 10/20/2020  WBC, UA     0 - 5 /hpf 0-5  RBC     0 - 2 /hpf None seen  Epithelial Cells (non renal)     0 - 10 /hpf None seen  Bacteria, UA     None seen/Few None seen   Lab Results  Component Value Date   WBC 5.7 08/30/2017   HGB 13.4 08/30/2017   HCT 39.7 (L) 08/30/2017   MCV 90.8 08/30/2017   PLT 235 08/30/2017    Lab Results  Component Value Date   CREATININE 0.88 08/30/2017   PSA as above I have reviewed the labs.  Pertinent imaging Results for STEFON, RAMTHUN (MRN [id number]) as of 10/20/2020 14:48  Ref. Range 10/20/2020 14:39  Scan Result Unknown 0 ml     Assessment & Plan:    1.  BPH with LUTS IPSS score is 12/2, it is stable Continue conservative management, avoiding bladder irritants and timed voiding's At goal with oxybutynin Continue oxybutynin XL 15 mg daily  RTC in 6 months for PSA, I PSS, PVR and exam  2. Family history of prostate cancer Status post previous biopsy which was negative RTC in 6 months for PSA and exam  3. Prostate nodule As above, likely BPH nodule   Return in about 6 months (around 04/20/2021) for IPSS, PSA, PVR and exam.  Zara Council, [facility]  [place] [address], [place], [place] [address] [phone]

## 2020-10-20 ENCOUNTER — Other Ambulatory Visit: Payer: Self-pay

## 2020-10-20 ENCOUNTER — Encounter: Payer: Self-pay | Admitting: Urology

## 2020-10-20 ENCOUNTER — Ambulatory Visit (INDEPENDENT_AMBULATORY_CARE_PROVIDER_SITE_OTHER): Payer: Medicare HMO | Admitting: Urology

## 2020-10-20 VITALS — BP 140/80 | HR 77 | Ht 70.0 in | Wt 138.0 lb

## 2020-10-20 DIAGNOSIS — N402 Nodular prostate without lower urinary tract symptoms: Secondary | ICD-10-CM

## 2020-10-20 DIAGNOSIS — N401 Enlarged prostate with lower urinary tract symptoms: Secondary | ICD-10-CM | POA: Diagnosis not present

## 2020-10-20 DIAGNOSIS — R350 Frequency of micturition: Secondary | ICD-10-CM | POA: Diagnosis not present

## 2020-10-20 LAB — BLADDER SCAN AMB NON-IMAGING: Scan Result: 0

## 2020-10-21 LAB — URINALYSIS, COMPLETE
Bilirubin, UA: NEGATIVE
Glucose, UA: NEGATIVE
Ketones, UA: NEGATIVE
Leukocytes,UA: NEGATIVE
Nitrite, UA: NEGATIVE
Protein,UA: NEGATIVE
RBC, UA: NEGATIVE
Specific Gravity, UA: 1.02 (ref 1.005–1.030)
Urobilinogen, Ur: 0.2 mg/dL (ref 0.2–1.0)
pH, UA: 5.5 (ref 5.0–7.5)

## 2020-10-21 LAB — MICROSCOPIC EXAMINATION
Bacteria, UA: NONE SEEN
Epithelial Cells (non renal): NONE SEEN /hpf (ref 0–10)
RBC, Urine: NONE SEEN /hpf (ref 0–2)

## 2020-11-02 ENCOUNTER — Telehealth: Payer: Self-pay

## 2020-11-02 DIAGNOSIS — Z122 Encounter for screening for malignant neoplasm of respiratory organs: Secondary | ICD-10-CM

## 2020-11-02 DIAGNOSIS — Z87891 Personal history of nicotine dependence: Secondary | ICD-10-CM

## 2020-11-02 NOTE — Telephone Encounter (Signed)
Contacted patient for lung CT screening program based on referral from Dr. Ladoris Gene.  Patient agreeable and CT scheduled for Wed, Dec 8 at 10:00 am.  Directions to imaging center and phone number to [facility] given to patient.  Patient confirms he has medicaid.  Patient is a former smoker and stopped smoking about 12 years ago.  He started smoking at age 64 and smoked up to 1 pack a day (average around 75%).

## 2020-11-03 ENCOUNTER — Encounter: Payer: Self-pay | Admitting: *Deleted

## 2020-11-03 NOTE — Telephone Encounter (Signed)
Former smoker, quit 2009, 28.5 pack year

## 2020-11-03 NOTE — Addendum Note (Signed)
Addended by: Lieutenant Diego on: 11/03/2020 01:26 PM   Modules accepted: Orders

## 2020-12-07 ENCOUNTER — Ambulatory Visit
Admission: RE | Admit: 2020-12-07 | Discharge: 2020-12-07 | Disposition: A | Discharge status: Home / Self Care | Payer: Medicare HMO | Source: Ambulatory Visit | Attending: Oncology | Admitting: Oncology

## 2020-12-07 ENCOUNTER — Other Ambulatory Visit: Payer: Self-pay

## 2020-12-07 ENCOUNTER — Inpatient Hospital Stay: Payer: Medicare HMO | Attending: Oncology | Admitting: Oncology

## 2020-12-07 DIAGNOSIS — Z87891 Personal history of nicotine dependence: Secondary | ICD-10-CM | POA: Diagnosis present

## 2020-12-07 DIAGNOSIS — Z122 Encounter for screening for malignant neoplasm of respiratory organs: Secondary | ICD-10-CM | POA: Diagnosis present

## 2020-12-07 NOTE — Progress Notes (Signed)
Virtual Visit via Video Note  I connected with [name] on 12/07/20 at 10:00 AM EST by a video enabled telemedicine application and verified that I am speaking with the correct person using two identifiers.  Location: Patient: [facility] Provider: Clinic   I discussed the limitations of evaluation and management by telemedicine and the availability of in person appointments. The patient expressed understanding and agreed to proceed.  I discussed the assessment and treatment plan with the patient. The patient was provided an opportunity to ask questions and all were answered. The patient agreed with the plan and demonstrated an understanding of the instructions.   The patient was advised to call back or seek an in-person evaluation if the symptoms worsen or if the condition fails to improve as anticipated.   In accordance with CMS guidelines, patient has met eligibility criteria including age, absence of signs or symptoms of lung cancer.  Social History   Tobacco Use  . Smoking status: Former Smoker    Packs/day: 0.75    Years: 38.00    Pack years: 28.50    Types: Cigarettes    Quit date: 2009    Years since quitting: 12.9  . Smokeless tobacco: Never Used  Vaping Use  . Vaping Use: Never used  Substance Use Topics  . Alcohol use: No    Alcohol/week: 0.0 standard drinks  . Drug use: Yes    Types: Marijuana      A shared decision-making session was conducted prior to the performance of CT scan. This includes one or more decision aids, includes benefits and harms of screening, follow-up diagnostic testing, over-diagnosis, false positive rate, and total radiation exposure.   Counseling on the importance of adherence to annual lung cancer LDCT screening, impact of co-morbidities, and ability or willingness to undergo diagnosis and treatment is imperative for compliance of the program.   Counseling on the importance of continued smoking cessation for former smokers; the importance of  smoking cessation for current smokers, and information about tobacco cessation interventions have been given to patient including [place] and [facility] quit [place] programs.   Written order for lung cancer screening with LDCT has been given to the patient and any and all questions have been answered to the best of my abilities.    Yearly follow up will be coordinated by Burgess Estelle, Thoracic Navigator.  I provided 15 minutes of face-to-face video visit time during this encounter, and > 50% was spent counseling as documented under my assessment & plan.   Jacquelin Hawking, NP

## 2020-12-12 ENCOUNTER — Telehealth: Payer: Self-pay | Admitting: *Deleted

## 2020-12-12 NOTE — Telephone Encounter (Signed)
Notified patient of LDCT lung cancer screening program results with recommendation for 12 month follow up imaging. Also notified of incidental findings noted below and is encouraged to discuss further with PCP who will receive a copy of this note and/or the CT report. Patient verbalizes understanding.   IMPRESSION: 1. Lung-RADS 1, negative. Continue annual screening with low-dose chest CT without contrast in 12 months. 2. Aortic atherosclerosis (ICD10-I70.0). Coronary artery calcification. 3.  Emphysema (ICD10-J43.9).

## 2021-02-06 ENCOUNTER — Other Ambulatory Visit: Payer: Self-pay | Admitting: Urology

## 2021-02-23 ENCOUNTER — Emergency Department
Admission: EM | Admit: 2021-02-23 | Discharge: 2021-02-23 | Discharge status: Against Medical Advice | Payer: Medicare Other | Attending: Emergency Medicine | Admitting: Emergency Medicine

## 2021-02-23 ENCOUNTER — Emergency Department: Payer: Medicare Other

## 2021-02-23 ENCOUNTER — Other Ambulatory Visit: Payer: Self-pay

## 2021-02-23 DIAGNOSIS — I10 Essential (primary) hypertension: Secondary | ICD-10-CM | POA: Diagnosis not present

## 2021-02-23 DIAGNOSIS — Z79899 Other long term (current) drug therapy: Secondary | ICD-10-CM | POA: Diagnosis not present

## 2021-02-23 DIAGNOSIS — Z20822 Contact with and (suspected) exposure to covid-19: Secondary | ICD-10-CM | POA: Insufficient documentation

## 2021-02-23 DIAGNOSIS — K529 Noninfective gastroenteritis and colitis, unspecified: Secondary | ICD-10-CM | POA: Insufficient documentation

## 2021-02-23 DIAGNOSIS — I2510 Atherosclerotic heart disease of native coronary artery without angina pectoris: Secondary | ICD-10-CM | POA: Diagnosis not present

## 2021-02-23 DIAGNOSIS — R109 Unspecified abdominal pain: Secondary | ICD-10-CM | POA: Diagnosis present

## 2021-02-23 DIAGNOSIS — R55 Syncope and collapse: Secondary | ICD-10-CM | POA: Diagnosis not present

## 2021-02-23 DIAGNOSIS — Z87891 Personal history of nicotine dependence: Secondary | ICD-10-CM | POA: Diagnosis not present

## 2021-02-23 DIAGNOSIS — R059 Cough, unspecified: Secondary | ICD-10-CM | POA: Insufficient documentation

## 2021-02-23 LAB — COMPREHENSIVE METABOLIC PANEL
ALT: 23 U/L (ref 0–44)
AST: 28 U/L (ref 15–41)
Albumin: 4.2 g/dL (ref 3.5–5.0)
Alkaline Phosphatase: 114 U/L (ref 38–126)
Anion gap: 13 (ref 5–15)
BUN: 12 mg/dL (ref 8–23)
CO2: 21 mmol/L — ABNORMAL LOW (ref 22–32)
Calcium: 9.6 mg/dL (ref 8.9–10.3)
Chloride: 103 mmol/L (ref 98–111)
Creatinine, Ser: 0.83 mg/dL (ref 0.61–1.24)
GFR, Estimated: >60 mL/min (ref >60)
Glucose, Bld: 84 mg/dL (ref 70–99)
Potassium: 3.9 mmol/L (ref 3.5–5.1)
Sodium: 137 mmol/L (ref 135–145)
Total Bilirubin: 1.2 mg/dL (ref 0.3–1.2)
Total Protein: 8.2 g/dL — ABNORMAL HIGH (ref 6.5–8.1)

## 2021-02-23 LAB — RESP PANEL BY RT-PCR (FLU A&B, COVID) ARPGX2
Influenza A by PCR: NEGATIVE
Influenza B by PCR: NEGATIVE
SARS Coronavirus 2 by RT PCR: NEGATIVE

## 2021-02-23 LAB — CBC
HCT: 43.4 % (ref 39.0–52.0)
Hemoglobin: 14.5 g/dL (ref 13.0–17.0)
MCH: 30.4 pg (ref 26.0–34.0)
MCHC: 33.4 g/dL (ref 30.0–36.0)
MCV: 91 fL (ref 80.0–100.0)
Platelets: 291 10*3/uL (ref 150–400)
RBC: 4.77 MIL/uL (ref 4.22–5.81)
RDW: 13.1 % (ref 11.5–15.5)
WBC: 8.6 10*3/uL (ref 4.0–10.5)
nRBC: 0 % (ref 0.0–0.2)

## 2021-02-23 LAB — TROPONIN I (HIGH SENSITIVITY)
Troponin I (High Sensitivity): 15 ng/L (ref <18)
Troponin I (High Sensitivity): 15 ng/L (ref <18)

## 2021-02-23 LAB — PHENYTOIN LEVEL, TOTAL: Phenytoin Lvl: 5.1 ug/mL — ABNORMAL LOW (ref 10.0–20.0)

## 2021-02-23 LAB — MAGNESIUM: Magnesium: 1.8 mg/dL (ref 1.7–2.4)

## 2021-02-23 LAB — TSH: TSH: 1.013 u[IU]/mL (ref 0.350–4.500)

## 2021-02-23 LAB — LIPASE, BLOOD: Lipase: 20 U/L (ref 11–51)

## 2021-02-23 MED ORDER — ALUM & MAG HYDROXIDE-SIMETH 200-200-20 MG/5ML PO SUSP
30.0000 mL | Freq: Once | ORAL | Status: AC
  Administered 2021-02-23: 30 mL via ORAL
  Filled 2021-02-23: qty 30

## 2021-02-23 MED ORDER — ONDANSETRON HCL 4 MG PO TABS: 4.0000 mg | ORAL_TABLET | Freq: Three times a day (TID) | ORAL | 0 refills | Status: DC | PRN

## 2021-02-23 MED ORDER — ONDANSETRON HCL 4 MG/2ML IJ SOLN: 4.0000 mg | Freq: Once | INTRAMUSCULAR | Status: DC

## 2021-02-23 MED ORDER — PANTOPRAZOLE SODIUM 40 MG PO TBEC
40.0000 mg | DELAYED_RELEASE_TABLET | Freq: Once | ORAL | Status: AC
  Administered 2021-02-23: 40 mg via ORAL
  Filled 2021-02-23: qty 1

## 2021-02-23 MED ORDER — SUCRALFATE 1 G PO TABS
1.0000 g | ORAL_TABLET | Freq: Once | ORAL | Status: AC
  Administered 2021-02-23: 1 g via ORAL
  Filled 2021-02-23: qty 1

## 2021-02-23 MED ORDER — LACTATED RINGERS IV BOLUS
1000.0000 mL | Freq: Once | INTRAVENOUS | Status: AC
  Administered 2021-02-23: 1000 mL via INTRAVENOUS

## 2021-02-23 MED ORDER — PROCHLORPERAZINE EDISYLATE 10 MG/2ML IJ SOLN
10.0000 mg | Freq: Once | INTRAMUSCULAR | Status: AC
  Administered 2021-02-23: 10 mg via INTRAVENOUS
  Filled 2021-02-23: qty 2

## 2021-02-23 NOTE — ED Provider Notes (Signed)
[facility] Emergency Department Provider Note  ____________________________________________   Event Date/Time   First MD Initiated Contact with Patient 02/23/21 1449     (approximate)  I have reviewed the triage vital signs and the nursing notes.   HISTORY  Chief Complaint Abdominal Pain   HPI Kenneth Daniels is a 65 y.o. male with a past medical history of CAD, HTN, HDL, seizure disorder on Dilantin, anxiety, anemia, PAD, BPH and recent tooth removal last week who presents for assessment of multiple symptoms including dizziness, headache, nausea, vomiting, diarrhea, cough and abdominal pain that began yesterday.  Patient states he thinks he fell yesterday as he woke up on the floor at turning he thinks he passed out and remembers feeling very dizzy.  States he has not had much to eat or drink.  States his emesis including dark but he has not had any dark or melanotic stools or any blood in stool.  He denies any fevers, chest pain, back pain, urinary symptoms, rash or focal extremity weakness numbness or tingling.  Denies any EtOH use illicit drug use or tobacco abuse.  States he is taking a new pain medicine prescribed by his dentist but does not know what it is.  He is not sure if it is combining with his neuropathy medicine to make him feel more dizzy.  No clear alleviating aggravating or other precipitating factors.         Past Medical History:  Diagnosis Date  . 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 Anxiety   . Atypical chest pain 04/17/2016  . CAD in native artery 04/17/2016  . Cerebral infarction ([place]) 11/25/2013   Overview:  IMO Problem List Replacer Jan. 2016   . Cervical spinal stenosis 07/06/2015  . Essential (primary) hypertension 04/15/2008  . 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 Headache, migraine 11/25/2013  . Heart murmur   . Hepatitis    Hep "C"  . HLD (hyperlipidemia) 03/27/2012  . Hyperlipidemia   . Idiopathic localized  osteoarthropathy 02/26/2011  . Major depressive disorder with single episode 11/14/2009  . Myocardial infarction ([place]) 2009  . 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 Seizures ([place])    Last Seizure 2013  . Stroke [facility])     Patient Active Problem List   Diagnosis Date Noted  . BPH without obstruction/lower urinary tract symptoms 10/06/2019  . PAD (peripheral artery disease) ([place]) 04/06/2019  . Stenosis of carotid artery 04/06/2019  . Amaurosis fugax of right eye 04/06/2019  . Cigarette smoker 04/06/2019  . Neck pain 04/06/2019  . Nonintractable epilepsy without status epilepticus ([place]) 04/06/2019  . Vitamin D deficiency 06/06/2016  . Atypical chest pain 04/17/2016  . CAD in native artery 04/17/2016  . Encounter for preprocedural cardiovascular examination 04/17/2016  . 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 Cervical spinal stenosis 07/06/2015  . 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 Headache, primary stabbing 10/06/2014  . Cerebral infarction ([place]) 11/25/2013  . Migraines 11/25/2013  . 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 Hyperlipidemia 03/27/2012  . Idiopathic localized osteoarthropathy 02/26/2011  . Major depressive disorder, single episode 11/14/2009  . Drug abuse, marijuana 11/14/2009  . Essential hypertension 04/15/2008  . 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 ABDOMINAL EXPLORATION SURGERY  2010?  Marland Kitchen BACK SURGERY    . CERVICAL DISC ARTHROPLASTY N/A 09/11/2017   Procedure: CERVICAL ANTERIOR DISC ARTHROPLASTY C3-4 POSSIBLE C4-5;  Surgeon: Meade Maw, MD;  Location: [facility] ORS;  Service:  Neurosurgery;  Laterality: N/A;  . COLON SURGERY     Colon Resection  . HERNIA REPAIR    . HOLMIUM LASER APPLICATION N/A 26/37/8588   Procedure: HOLMIUM LASER APPLICATION;  Surgeon: Hollice Espy, MD;  Location: [facility] ORS;  Service: Urology;  Laterality: N/A;  . PROSTATE ABLATION N/A 11/20/2016   Procedure:  PROSTATE ABLATION;  Surgeon: Hollice Espy, MD;  Location: [facility] ORS;  Service: Urology;  Laterality: N/A;  . SOFT TISSUE TUMOR RESECTION     left thigh  . TRANSURETHRAL RESECTION OF PROSTATE N/A 11/20/2016   Procedure: TRANSURETHRAL RESECTION OF THE PROSTATE (TURP);  Surgeon: Hollice Espy, MD;  Location: [facility] ORS;  Service: Urology;  Laterality: N/A;    Prior to Admission medications   Medication Sig Start Date End Date Taking? Authorizing Provider  ondansetron (ZOFRAN) 4 MG tablet Take 1 tablet (4 mg total) by mouth every 8 (eight) hours as needed for up to 10 doses for nausea or vomiting. 02/23/21  Yes Lucrezia Starch, MD  aspirin EC 81 MG tablet Take 1 tablet (81 mg total) by mouth daily. 04/06/19   Minna Merritts, MD  cholecalciferol (VITAMIN D) 1000 units tablet Take 1,000 Units by mouth daily.    [provider]  Cholecalciferol (VITAMIN D-3) 5000 units TABS Take 5,000 Units by mouth daily.    [provider]  citalopram (CELEXA) 20 MG tablet  10/13/20   [provider]  hydrochlorothiazide (HYDRODIURIL) 25 MG tablet Take 25 mg by mouth daily.    [provider]  lisinopril (ZESTRIL) 10 MG tablet TAKE 1 TABLET BY MOUTH DAILY FOR HIGH BP 01/23/16   [provider]  oxybutynin (DITROPAN XL) 15 MG 24 hr tablet TAKE 1 TABLET (15 MG TOTAL) BY MOUTH DAILY. 02/06/21   McGowan, Larene Beach A, PA-C  phenytoin (DILANTIN) 100 MG ER capsule TAKE 3 CAPSULES IN THE MORNING AND 2 CAPSULES EVERY EVENING 02/02/16   [provider]  phenytoin (DILANTIN) 50 MG tablet  10/13/20   [provider]  pregabalin (LYRICA) 75 MG capsule Take by mouth. 08/10/19   [provider]  simvastatin (ZOCOR) 20 MG tablet TAKE 1 TABLET EVERY EVENING FOR HIGH CHOLESTEROL 01/29/16   [provider]    Allergies Oxycodone, Gabapentin, Lisinopril, and Sulfa antibiotics  Family History  Problem Relation Age of Onset  . Bladder Cancer Neg Hx   .  Prostate cancer Neg Hx   . Kidney cancer Neg Hx     Social History Social History   Tobacco Use  . Smoking status: Former Smoker    Packs/day: 0.75    Years: 38.00    Pack years: 28.50    Types: Cigarettes    Quit date: 2009    Years since quitting: 13.1  . Smokeless tobacco: Never Used  Vaping Use  . Vaping Use: Never used  Substance Use Topics  . Alcohol use: No    Alcohol/week: 0.0 standard drinks  . Drug use: Yes    Types: Marijuana    Review of Systems  Review of Systems  Constitutional: Positive for malaise/fatigue. Negative for chills and fever.  HENT: Negative for sore throat.   Eyes: Negative for pain.  Respiratory: Positive for cough. Negative for stridor.   Cardiovascular: Negative for chest pain.  Gastrointestinal: Positive for abdominal pain, diarrhea, nausea and vomiting.  Genitourinary: Negative for dysuria.  Musculoskeletal: Negative for myalgias.  Skin: Negative for rash.  Neurological: Positive for dizziness and headaches. Negative for seizures and loss of  consciousness.  Psychiatric/Behavioral: Negative for suicidal ideas.  All other systems reviewed and are negative.     ____________________________________________   PHYSICAL EXAM:  VITAL SIGNS: ED Triage Vitals [02/23/21 1327]  Enc Vitals Group     BP 123/90     Pulse Rate 64     Resp 20     Temp 97.7 F (36.5 C)     Temp Source Oral     SpO2 100 %     Weight 145 lb (65.8 kg)     Height 5\' 10"  (1.778 m)     Head Circumference      Peak Flow      Pain Score 7     Pain Loc      Pain Edu?      Excl. in [place]?    Vitals:   02/23/21 1540 02/23/21 1549  BP: 127/66 (!) 142/71  Pulse:  (!) 49  Resp:  11  Temp:    SpO2:  98%   Physical Exam Vitals and nursing note reviewed.  Constitutional:      Appearance: He is well-developed and well-nourished.  HENT:     Head: Normocephalic and atraumatic.     Right Ear: External ear normal.     Left Ear: External ear normal.     Nose:  Nose normal.  Eyes:     Conjunctiva/sclera: Conjunctivae normal.  Cardiovascular:     Rate and Rhythm: Normal rate and regular rhythm.     Heart sounds: No murmur heard.   Pulmonary:     Effort: Pulmonary effort is normal. No respiratory distress.     Breath sounds: Normal breath sounds.  Abdominal:     Palpations: Abdomen is soft.     Tenderness: There is generalized abdominal tenderness. There is no right CVA tenderness or left CVA tenderness.  Musculoskeletal:        General: No edema.     Cervical back: Neck supple.     Right lower leg: No edema.     Left lower leg: No edema.  Skin:    General: Skin is warm and dry.     Capillary Refill: Capillary refill takes less than 2 seconds.  Neurological:     Mental Status: He is alert and oriented to person, place, and time.  Psychiatric:        Mood and Affect: Mood and affect and mood normal.     Cranial nerves II through XII grossly intact.  No pronator drift.  No finger dysmetria.  Symmetric 5/5 strength of all extremities.  Sensation intact to light touch in all extremities.  Unremarkable unassisted gait.  ____________________________________________   LABS (all labs ordered are listed, but only abnormal results are displayed)  Labs Reviewed  COMPREHENSIVE METABOLIC PANEL - Abnormal; Notable for the following components:      Result Value   CO2 21 (*)    Total Protein 8.2 (*)    All other components within normal limits  PHENYTOIN LEVEL, TOTAL - Abnormal; Notable for the following components:   Phenytoin Lvl 5.1 (*)    All other components within normal limits  RESP PANEL BY RT-PCR (FLU A&B, COVID) ARPGX2  LIPASE, BLOOD  CBC  TSH  MAGNESIUM  URINALYSIS, COMPLETE (UACMP) WITH MICROSCOPIC  TROPONIN I (HIGH SENSITIVITY)   ____________________________________________  EKG  Sinus rhythm with first AV block with variable of two twenty-eight, normal axis, otherwise unremarkable intervals and some nonspecific ST  changes in anterior lateral leads.  These changes all  appear largely similar to those seen on the CT obtained on 5/21. ____________________________________________  RADIOLOGY  ED MD interpretation: Chest x-ray has no clear consolidation, large effusion, edema, pneumothorax or other clear acute intrathoracic process.  CT head and C-spine showed no evidence of acute fracture injury or intracranial hemorrhage.  Official radiology report(s): DG Chest 2 View  Result Date: 02/23/2021 CLINICAL DATA:  Syncope EXAM: CHEST - 2 VIEW COMPARISON:  September 24, 2014 chest radiograph; chest CT December 07, 2020 FINDINGS: There is no edema or airspace opacity. The heart size and pulmonary vascularity are normal. No adenopathy. No bone lesions. IMPRESSION: No edema or airspace opacity. Cardiac silhouette within normal limits. Electronically Signed   By: Lowella Grip III M.D.   On: 02/23/2021 15:57   CT Head Wo Contrast  Result Date: 02/23/2021 CLINICAL DATA:  Dizziness with vomiting EXAM: CT HEAD WITHOUT CONTRAST TECHNIQUE: Contiguous axial images were obtained from the base of the skull through the vertex without intravenous contrast. COMPARISON:  October 12, 2004 FINDINGS: Brain: Ventricles and sulci are normal in size and configuration. There is a cavum septum pellucidum, an anatomic variant. Prominence of the cisterna magna is a stable anatomic variant. There is no intracranial mass, hemorrhage, extra-axial fluid collection, or midline shift. Brain parenchyma appears unremarkable. No demonstrable acute infarct. Vascular: No hyperdense vessel. Calcification is noted in each carotid siphon region. Skull: The bony calvarium appears intact. Sinuses/Orbits: Visualized paranasal sinuses are clear. Visualized orbits appear symmetric bilaterally. Other: Visualized mastoid air cells are clear. There is debris in the right external auditory canal. IMPRESSION: Normal appearing brain parenchyma.  No mass or hemorrhage.  There are foci of arterial vascular calcification. There is probable cerumen in the right external auditory canal. Electronically Signed   By: Lowella Grip III M.D.   On: 02/23/2021 15:55   CT Cervical Spine Wo Contrast  Result Date: 02/23/2021 CLINICAL DATA:  Dizziness since yesterday, loss of balance EXAM: CT CERVICAL SPINE WITHOUT CONTRAST TECHNIQUE: Multidetector CT imaging of the cervical spine was performed without intravenous contrast. Multiplanar CT image reconstructions were also generated. COMPARISON:  04/08/2014 FINDINGS: Alignment: Alignment is anatomic. Skull base and vertebrae: No acute fracture. No primary bone lesion or focal pathologic process. Soft tissues and spinal canal: No prevertebral fluid or swelling. No visible canal hematoma. Extensive atherosclerosis at the carotid bifurcations. Disc levels: Postsurgical changes from cervical fusion at C3-4. Left predominant facet hypertrophic changes at C2-3 and C3-4. Prominent anterior osteophyte formation at C4-5, C5-6, and C6-7. Symmetrical neural foraminal narrowing is seen at C3-4 related to circumferential osteophyte and uncovertebral hypertrophy. Upper chest: Airway is patent.  Lung apices are clear. Other: Reconstructed images demonstrate no additional findings. IMPRESSION: 1. No acute cervical spine fracture. 2. Multilevel cervical spondylosis and facet hypertrophy as above. 3. Previous C3-4 discectomy and fusion. Electronically Signed   By: Randa Ngo M.D.   On: 02/23/2021 15:54    ____________________________________________   PROCEDURES  Procedure(s) performed (including Critical Care):  .1-3 Lead EKG Interpretation Performed by: Lucrezia Starch, MD Authorized by: Lucrezia Starch, MD     Interpretation: normal     ECG rate assessment: normal     Rhythm: sinus rhythm     Ectopy: none     Conduction: abnormal     Abnormal conduction: 1st degree AV block        ____________________________________________   INITIAL IMPRESSION / ASSESSMENT AND PLAN / ED COURSE      Patient presents with above-stated exam for assessment  of lightheadedness, nausea, vomiting, diarrhea, cough and some crampy abdominal pain that has been going on for the last 2 to 3 days as well as a fall and apparent syncopal episode yesterday.  Patient is not exactly sure what happened with regard to this and is not sure if he hit his head or not.  He is not on any blood thinners.  There are no obvious evidence of trauma on exam.  He is nonfocal supine neuro exam.  Differential includes but is not limited to dehydration secondary to possible gastroenteritis, polypharmacy given he is on a new pain medicine from his dentist, unwitnessed seizure, arrhythmia, atypical presentation of ACS, symptomatic anemia, metabolic derangements, pneumonia, cholecystitis, pancreatitis versus other viral syndrome.  Low suspicion for toxic ingestion.  No obvious evidence of trauma on exam.  CT head and C-spine showed no evidence of acute trauma and chest x-ray is unremarkable for fracture pneumothorax or other acute intrathoracic process.  No fever elevated white blood cell count of consolidation to suggest pneumonia.  Patient does not appear acutely volume overloaded.  ECG does not show evidence of arrhythmia and given nonelevated troponins x2 of elicitation for ACS.  Patient describes his lightheadedness is more feeling like he is about to pass out than true vertigo or bilateral lower suspicion for CVA at this time.  Of note while undergoing CTs patient did become lightheaded and stated he felt acutely passed out.  Repeat EKG obtained at that time remarkable for sinus bradycardia with ventricular rate of 48 without other new evidence of acute ischemia or other significant underlying arrhythmia.  CMP shows no significant electrolyte or metabolic derangements.  Lipase of 20 is not consistent with acute  pancreatitis.  CBC is unremarkable.  TSH and magnesium are unremarkable.  Phenytoin level is subtherapeutic.  Troponin is nonelevated and given patient adamantly denies any chest pain with ECGs showing no clearance of acute ischemia or low suspicion for ACS.  However given presentation, but he is indeed plan to trend this.  I was notified by nursing staff the patient stated he felt better and wished to leave.  Did, speak with the patient.  Advised him that he was pending a repeat troponin level and that it was not clear why he had become bradycardic and folic he was in a pass out.  Concern for symptomatic bradycardia causing him to have passed out last night.  Patient states he is not worried about this.  I explained to the patient that I recommended additional hydration and observation pending repeat labs and that I was happy to give him some phenytoin IV as he had some nausea and vomiting.  He stated he wished to leave against my advice and would take his any treatment at home.  Stated he would come back if he felt something was worse but did not wish to stay for repeat troponin levels or any other monitoring.  I believe he had capacity to make the decision to leave against my advice.  He was discharged in stable condition and instructed to come back immediately if he were to change his mind or develop any new or acute worsening of symptoms.  ____________________________________________   FINAL CLINICAL IMPRESSION(S) / ED DIAGNOSES  Final diagnoses:  Gastroenteritis  Syncope, unspecified syncope type  Cough    Medications  lactated ringers bolus 1,000 mL (1,000 mLs Intravenous New Bag/Given 02/23/21 1605)  prochlorperazine (COMPAZINE) injection 10 mg (10 mg Intravenous Given 02/23/21 1602)  pantoprazole (PROTONIX) EC tablet 40 mg (40  mg Oral Given 02/23/21 1611)  alum & mag hydroxide-simeth (MAALOX/MYLANTA) 200-200-20 MG/5ML suspension 30 mL (30 mLs Oral Given 02/23/21 1612)  sucralfate (CARAFATE)  tablet 1 g (1 g Oral Given 02/23/21 1612)     ED Discharge Orders         Ordered    ondansetron (ZOFRAN) 4 MG tablet  Every 8 hours PRN        02/23/21 1643           Note:  This document was prepared using Dragon voice recognition software and may include unintentional dictation errors.   Lucrezia Starch, MD 02/23/21 [phone]

## 2021-02-23 NOTE — ED Triage Notes (Signed)
First Nurse Note:  Arrives with C/O feeling dizzy last night.  Awoke this morning at 0300 and dizziness worsened, difficulty keeping balance and walked into bathroom and vomited x 1.  Arrives AAOx3.  Skin warm and dry.  Speech clear.  MAE equally and strong.  Equal facial movements.  NAD

## 2021-02-23 NOTE — ED Triage Notes (Signed)
Pt c/o generalized abd pain with N/V since last night, states the emesis was dark in color. Denies diarrhea. States he is having some dizziness as well.

## 2021-02-23 NOTE — ED Notes (Signed)
Went to room due to call light.  Patient saying he wants to go home and he feels better now.  I gave him a pillow and tried to make him more comfortable on the stretcher, but he is still asking to be discharge.

## 2021-03-02 ENCOUNTER — Encounter: Payer: Self-pay | Admitting: Cardiovascular Disease

## 2021-03-31 ENCOUNTER — Other Ambulatory Visit: Payer: Self-pay | Admitting: Urology

## 2021-04-18 ENCOUNTER — Other Ambulatory Visit: Payer: Self-pay | Admitting: *Deleted

## 2021-04-18 DIAGNOSIS — R972 Elevated prostate specific antigen [PSA]: Secondary | ICD-10-CM

## 2021-04-20 ENCOUNTER — Other Ambulatory Visit: Payer: Self-pay

## 2021-04-21 ENCOUNTER — Encounter: Payer: Self-pay | Admitting: Urology

## 2021-04-24 ENCOUNTER — Telehealth: Payer: Self-pay | Admitting: Urology

## 2021-04-24 NOTE — Progress Notes (Deleted)
04/25/2021 11:27 AM   Kenneth Daniels [birth year] [id number]  Referring provider: Theotis Burrow, MD [address] [place],  [place] [address]  No chief complaint on file.  Urological history: 1. Bladder neck obstruction -s/p TULIP 2017 -cysto 11/2019 widely patent bladder neck, no evidence of bladder neck contracture or recurrence of his stricture  2. Prostate nodule -stable left mid/ apical nodule < 1 cm  -prostate biopsy 08/2018 due to the presence of the nodule and a strong family history of advanced metastatic prostate cancer.  Prostate biopsy was benign.  TRUS vol 42 g.  iPSA 1.6  3. Family history of prostate cancer -cousin died of prostate cancer, age 52  4. BPH with LU TS -I PSS *** -PVR ***  5. Nocturia -managed with oxybutynin XL 15 mg daily     HPI: Kenneth Daniels is a 65 y.o. male who presents today for a 6 month follow up.     Score:  1-7 Mild 8-19 Moderate 20-35 Severe  PMH: Past Medical History:  Diagnosis Date  . 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 Anxiety   . Atypical chest pain 04/17/2016  . CAD in native artery 04/17/2016  . Cerebral infarction ([place]) 11/25/2013   Overview:  IMO Problem List Replacer Jan. 2016   . Cervical spinal stenosis 07/06/2015  . Essential (primary) hypertension 04/15/2008  . 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 Headache, migraine 11/25/2013  . Heart murmur   . Hepatitis    Hep "C"  . HLD (hyperlipidemia) 03/27/2012  . Hyperlipidemia   . Idiopathic localized osteoarthropathy 02/26/2011  . Major depressive disorder with single episode 11/14/2009  . Myocardial infarction ([place]) 2009  . 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 Seizures ([place])    Last Seizure 2013  . Stroke [facility])     Surgical History: Past Surgical History:  Procedure Laterality Date  . ABDOMINAL EXPLORATION SURGERY  2010?  [facility] BACK SURGERY    . CERVICAL DISC ARTHROPLASTY N/A 09/11/2017   Procedure: CERVICAL ANTERIOR DISC  ARTHROPLASTY C3-4 POSSIBLE C4-5;  Surgeon: Meade Maw, MD;  Location: [facility] ORS;  Service: Neurosurgery;  Laterality: N/A;  . COLON SURGERY     Colon Resection  . HERNIA REPAIR    . HOLMIUM LASER APPLICATION N/A 77/82/4235   Procedure: HOLMIUM LASER APPLICATION;  Surgeon: Hollice Espy, MD;  Location: [facility] ORS;  Service: Urology;  Laterality: N/A;  . PROSTATE ABLATION N/A 11/20/2016   Procedure: PROSTATE ABLATION;  Surgeon: Hollice Espy, MD;  Location: [facility] ORS;  Service: Urology;  Laterality: N/A;  . SOFT TISSUE TUMOR RESECTION     left thigh  . TRANSURETHRAL RESECTION OF PROSTATE N/A 11/20/2016   Procedure: TRANSURETHRAL RESECTION OF THE PROSTATE (TURP);  Surgeon: Hollice Espy, MD;  Location: [facility] ORS;  Service: Urology;  Laterality: N/A;    Home Medications:  Allergies as of 04/25/2021      Reactions   Oxycodone Other (See Comments)   Other reaction(s): Confusion "Had me all messed up" "Had me all messed up"   Gabapentin Other (See Comments)   Lisinopril    Sulfa Antibiotics Rash      Medication List       Accurate as of April 24, 2021 11:27 AM. If you have any questions, ask your nurse or doctor.        aspirin EC 81 MG tablet Take 1 tablet (81 mg total) by mouth daily.   cholecalciferol 1000 units tablet Commonly known as: VITAMIN D Take 1,000 Units  by mouth daily.   Vitamin D-3 125 MCG (5000 UT) Tabs Take 5,000 Units by mouth daily.   citalopram 20 MG tablet Commonly known as: CELEXA   hydrochlorothiazide 25 MG tablet Commonly known as: HYDRODIURIL Take 25 mg by mouth daily.   lisinopril 10 MG tablet Commonly known as: ZESTRIL TAKE 1 TABLET BY MOUTH DAILY FOR HIGH BP   ondansetron 4 MG tablet Commonly known as: Zofran Take 1 tablet (4 mg total) by mouth every 8 (eight) hours as needed for up to 10 doses for nausea or vomiting.   oxybutynin 15 MG 24 hr tablet Commonly known as: DITROPAN XL TAKE 1 TABLET BY MOUTH DAILY   phenytoin 100 MG ER  capsule Commonly known as: DILANTIN TAKE 3 CAPSULES IN THE MORNING AND 2 CAPSULES EVERY EVENING   phenytoin 50 MG tablet Commonly known as: DILANTIN   pregabalin 75 MG capsule Commonly known as: LYRICA Take by mouth.   simvastatin 20 MG tablet Commonly known as: ZOCOR TAKE 1 TABLET EVERY EVENING FOR HIGH CHOLESTEROL       Allergies:  Allergies  Allergen Reactions  . Oxycodone Other (See Comments)    Other reaction(s): Confusion "Had me all messed up" "Had me all messed up"  . Gabapentin Other (See Comments)  . Lisinopril   . Sulfa Antibiotics Rash    Family History: Family History  Problem Relation Age of Onset  . Bladder Cancer Neg Hx   . Prostate cancer Neg Hx   . Kidney cancer Neg Hx     Social History:  reports that he quit smoking about 13 years ago. His smoking use included cigarettes. He has a 28.50 pack-year smoking history. He has never used smokeless tobacco. He reports current drug use. Drug: Marijuana. He reports that he does not drink alcohol.  ROS: For pertinent review of systems please refer to history of present illness  Physical Exam: There were no vitals taken for this visit.  Constitutional:  Well nourished. Alert and oriented, No acute distress. HEENT: [place] AT, moist mucus membranes.  Trachea midline Cardiovascular: No clubbing, cyanosis, or edema. Respiratory: Normal respiratory effort, no increased work of breathing. GI: Abdomen is soft, non tender, non distended, no abdominal masses. Liver and spleen not palpable.  No hernias appreciated.  Stool sample for occult testing is not indicated.   GU: No CVA tenderness.  No bladder fullness or masses.  Patient with circumcised/uncircumcised phallus. ***Foreskin easily retracted***  Urethral meatus is patent.  No penile discharge. No penile lesions or rashes. Scrotum without lesions, cysts, rashes and/or edema.  Testicles are located scrotally bilaterally. No masses are appreciated in the testicles. Left  and right epididymis are normal. Rectal: Patient with  normal sphincter tone. Anus and perineum without scarring or rashes. No rectal masses are appreciated. Prostate is approximately *** grams, *** nodules are appreciated. Seminal vesicles are normal. Skin: No rashes, bruises or suspicious lesions. Lymph: No inguinal adenopathy. Neurologic: Grossly intact, no focal deficits, moving all 4 extremities. Psychiatric: Normal mood and affect.  Laboratory Data: TSH 0.350 - 4.500 uIU/mL 1.013   Comment: Performed by a 3rd Generation assay with a functional sensitivity of <=0.01 uIU/mL.  Performed at [facility], [address] [place], [place] [address]   Resulting Agency  [facility]         Specimen Collected: 02/23/21 13:30 Last Resulted: 02/23/21 16:28       Component     Latest Ref Rng & Units 02/23/2021  WBC  4.0 - 10.5 K/uL 8.6  RBC     4.22 - 5.81 MIL/uL 4.77  Hemoglobin     13.0 - 17.0 g/dL 14.5  HCT     39.0 - 52.0 % 43.4  MCV     80.0 - 100.0 fL 91.0  MCH     26.0 - 34.0 pg 30.4  MCHC     30.0 - 36.0 g/dL 33.4  RDW     11.5 - 15.5 % 13.1  Platelets     150 - 400 K/uL 291  nRBC     0.0 - 0.2 % 0.0  WBC, UA     0 - 5 /hpf   Epithelial Cells (non renal)     0 - 10 /hpf   Bacteria, UA     None seen/Few    Component     Latest Ref Rng & Units 02/23/2021  Sodium     135 - 145 mmol/L 137  Potassium     3.5 - 5.1 mmol/L 3.9  Chloride     98 - 111 mmol/L 103  CO2     22 - 32 mmol/L 21 (L)  Glucose     70 - 99 mg/dL 84  BUN     8 - 23 mg/dL 12  Creatinine     0.61 - 1.24 mg/dL 0.83  Calcium     8.9 - 10.3 mg/dL 9.6  Total Protein     6.5 - 8.1 g/dL 8.2 (H)  Albumin     3.5 - 5.0 g/dL 4.2  AST     15 - 41 U/L 28  ALT     0 - 44 U/L 23  Alkaline Phosphatase     38 - 126 U/L 114  Total Bilirubin     0.3 - 1.2 mg/dL 1.2  GFR, Estimated     >60 mL/min >60  Anion gap     5 - 15 13   I have reviewed the labs.  Pertinent  imaging ***  Assessment & Plan:    1.  BPH with LUTS IPSS score is 12/2, it is stable Continue conservative management, avoiding bladder irritants and timed voiding's At goal with oxybutynin Continue oxybutynin XL 15 mg daily RTC in 6 months for PSA, I PSS, PVR and exam  2. Family history of prostate cancer Status post previous biopsy which was negative RTC in 6 months for PSA and exam  3. Prostate nodule As above, likely BPH nodule   No follow-ups on file.  Zara Council, PA-C  [facility] [address], [place], [place] [address] [phone]

## 2021-04-25 ENCOUNTER — Encounter: Payer: Self-pay | Admitting: Urology

## 2021-04-25 ENCOUNTER — Ambulatory Visit: Payer: Self-pay | Admitting: Urology

## 2021-04-25 DIAGNOSIS — N138 Other obstructive and reflux uropathy: Secondary | ICD-10-CM

## 2021-04-25 DIAGNOSIS — Z8042 Family history of malignant neoplasm of prostate: Secondary | ICD-10-CM

## 2021-04-25 DIAGNOSIS — N402 Nodular prostate without lower urinary tract symptoms: Secondary | ICD-10-CM

## 2021-04-25 DIAGNOSIS — R351 Nocturia: Secondary | ICD-10-CM

## 2021-05-22 ENCOUNTER — Other Ambulatory Visit: Payer: Self-pay | Admitting: Urology

## 2021-05-26 ENCOUNTER — Other Ambulatory Visit: Payer: Self-pay | Admitting: Urology

## 2021-06-19 ENCOUNTER — Other Ambulatory Visit: Payer: Self-pay | Admitting: Urology

## 2021-06-21 ENCOUNTER — Encounter: Payer: Self-pay | Admitting: Cardiovascular Disease

## 2021-06-21 ENCOUNTER — Other Ambulatory Visit: Payer: Self-pay

## 2021-06-21 ENCOUNTER — Ambulatory Visit (INDEPENDENT_AMBULATORY_CARE_PROVIDER_SITE_OTHER): Payer: Medicare Other | Admitting: Cardiovascular Disease

## 2021-06-21 VITALS — BP 128/76 | HR 51 | Ht 70.0 in | Wt 137.2 lb

## 2021-06-21 DIAGNOSIS — I2510 Atherosclerotic heart disease of native coronary artery without angina pectoris: Secondary | ICD-10-CM

## 2021-06-21 DIAGNOSIS — E782 Mixed hyperlipidemia: Secondary | ICD-10-CM

## 2021-06-21 DIAGNOSIS — I6529 Occlusion and stenosis of unspecified carotid artery: Secondary | ICD-10-CM | POA: Diagnosis not present

## 2021-06-21 DIAGNOSIS — I10 Essential (primary) hypertension: Secondary | ICD-10-CM | POA: Diagnosis not present

## 2021-06-21 DIAGNOSIS — I739 Peripheral vascular disease, unspecified: Secondary | ICD-10-CM | POA: Diagnosis not present

## 2021-06-21 NOTE — Progress Notes (Signed)
Date:  06/21/2021   ID:  Kenneth Daniels, DOB 07-Jun-1956, MRN [id number]  Patient Location:  [place] [address]   Provider location:   [facility], [place] office  PCP:  Theotis Burrow, MD  Cardiologist:  Patsy Baltimore  Chief Complaint  Patient presents with   12 month follow up     "Doing well." Medications reviewed by the patient verbally.      History of Present Illness:    Kenneth Daniels is a 65 y.o. male past medical history of Smoker.remote, stopped 65 yo carotid dz Epilepsy treatment for his hepatitis C.    mental health issues.  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 C3-4 moderate spinal canal stenosis as well as some neural foraminal stenosis. suffered a myocardial infarction at [facility] in the past,per patient, no records Who presents for routine follow-up of his PAD, carotid stenosis  Last seen by telemetry visit April 2020  ER 01/2021 dizziness, headache, nausea, vomiting, diarrhea, cough and abdominal pain that began yesterday. Gastroenteritis  PMD stopped BP medication Stopped lisinopril and HCTZ  Feels BP stable Today 128/76  Prior stress test at [facility] teeth, lost weight, weight 150 to 137 Having dentrures made  Carotid ultrasound April 2020 less than 39% disease bilaterally  EKG personally reviewed by myself on todays visit NSR rate 51 bpm, no ST or T wave change  Discussed previous MI at [facility] many years ago, Records from this are currently unavailable.  "chest pain, SOB", called EMS, given NTG No prior cardiac catheterization   Prior CV studies:   The following studies were reviewed today:  2014 ICA of a less than 40% stenosis.  Total chol 135, LDL 61 (02/2018) HBA1C 5.4  MRI 2018 cervical  Multilevel spondylosis as described. Progressive stenosis at C3-4, with cord flattening but no abnormal cord signal. Canal diameter 6-7 mm. LEFT greater than RIGHT C4  foraminal narrowing. Modic type 1 endplate appearance, consistent with active ongoing degenerative change. Unremarkable postsurgical change at C6-7, with only mild LEFT C7 foraminal narrowing, and no postsurgical complication. Advanced disc space narrowing at C5-6.  LEFT C6 foraminal narrowing.  2017  Stress test, preop for back surgery The study is normal. This is a low risk study. The left ventricular ejection fraction is normal (55-65%). There was no ST segment deviation noted during stress.   Past Medical History:  Diagnosis Date   Absolute anemia 03/27/2012   Anxiety    Atypical chest pain 04/17/2016   CAD in native artery 04/17/2016   Cerebral infarction ([place]) 11/25/2013   Overview:  IMO Problem List Replacer Jan. 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   BACK SURGERY     CERVICAL DISC ARTHROPLASTY N/A 09/11/2017   Procedure: CERVICAL ANTERIOR DISC ARTHROPLASTY C3-4 POSSIBLE C4-5;  Surgeon: Meade Maw, MD;  Location: [facility] ORS;  Service: Neurosurgery;  Laterality:  N/A;   COLON SURGERY     Colon Resection   HERNIA REPAIR     HOLMIUM LASER APPLICATION N/A 64/40/3474   Procedure: HOLMIUM LASER APPLICATION;  Surgeon: Hollice Espy, MD;  Location: [facility] ORS;  Service: Urology;  Laterality: N/A;   PROSTATE ABLATION N/A 11/20/2016   Procedure: PROSTATE ABLATION;  Surgeon: Hollice Espy, MD;  Location: [facility] ORS;  Service:  Urology;  Laterality: N/A;   SOFT TISSUE TUMOR RESECTION     left thigh   TRANSURETHRAL RESECTION OF PROSTATE N/A 11/20/2016   Procedure: TRANSURETHRAL RESECTION OF THE PROSTATE (TURP);  Surgeon: Hollice Espy, MD;  Location: [facility] ORS;  Service: Urology;  Laterality: N/A;     Current Meds  Medication Sig   aspirin EC 81 MG tablet Take 1 tablet (81 mg total) by mouth daily.   cholecalciferol (VITAMIN D) 1000 units tablet Take 1,000 Units by mouth daily.   Cholecalciferol (VITAMIN D-3) 5000 units TABS Take 5,000 Units by mouth daily.   citalopram (CELEXA) 20 MG tablet    ondansetron (ZOFRAN) 4 MG tablet Take 1 tablet (4 mg total) by mouth every 8 (eight) hours as needed for up to 10 doses for nausea or vomiting.   oxybutynin (DITROPAN XL) 15 MG 24 hr tablet TAKE 1 TABLET BY MOUTH DAILY   phenytoin (DILANTIN) 100 MG ER capsule TAKE 3 CAPSULES IN THE MORNING AND 2 CAPSULES EVERY EVENING   phenytoin (DILANTIN) 50 MG tablet    pregabalin (LYRICA) 75 MG capsule Take by mouth.   simvastatin (ZOCOR) 20 MG tablet TAKE 1 TABLET EVERY EVENING FOR HIGH CHOLESTEROL   [DISCONTINUED] hydrochlorothiazide (HYDRODIURIL) 25 MG tablet Take 25 mg by mouth daily.   [DISCONTINUED] lisinopril (ZESTRIL) 10 MG tablet TAKE 1 TABLET BY MOUTH DAILY FOR HIGH BP     Allergies:   Oxycodone, Gabapentin, Lisinopril, and Sulfa antibiotics   Social History   Tobacco Use   Smoking status: Former    Packs/day: 0.75    Years: 38.00    Pack years: 28.50    Types: Cigarettes    Quit date: 2009    Years since quitting: 13.4   Smokeless tobacco: Never  Vaping Use   Vaping Use: Never used  Substance Use Topics   Alcohol use: No    Alcohol/week: 0.0 standard drinks   Drug use: Yes    Types: Marijuana     Current Outpatient Medications on File Prior to Visit  Medication Sig Dispense Refill   aspirin EC 81 MG tablet Take 1 tablet (81 mg total) by mouth daily. 90 tablet 3   cholecalciferol (VITAMIN D) 1000 units  tablet Take 1,000 Units by mouth daily.     Cholecalciferol (VITAMIN D-3) 5000 units TABS Take 5,000 Units by mouth daily.     citalopram (CELEXA) 20 MG tablet      ondansetron (ZOFRAN) 4 MG tablet Take 1 tablet (4 mg total) by mouth every 8 (eight) hours as needed for up to 10 doses for nausea or vomiting. 10 tablet 0   oxybutynin (DITROPAN XL) 15 MG 24 hr tablet TAKE 1 TABLET BY MOUTH DAILY 30 tablet 1   phenytoin (DILANTIN) 100 MG ER capsule TAKE 3 CAPSULES IN THE MORNING AND 2 CAPSULES EVERY EVENING  10   phenytoin (DILANTIN) 50 MG tablet      pregabalin (LYRICA) 75 MG capsule Take by mouth.     simvastatin (ZOCOR) 20 MG tablet TAKE 1 TABLET EVERY EVENING FOR HIGH CHOLESTEROL  3   No  current facility-administered medications on file prior to visit.     Family Hx: The patient's family history is negative for Bladder Cancer, Prostate cancer, and Kidney cancer.  ROS:   Please see the history of present illness.    Review of Systems  Constitutional: Negative.   Respiratory: Negative.    Cardiovascular: Negative.   Gastrointestinal: Negative.   Musculoskeletal:  Positive for neck pain.  Neurological: Negative.   Psychiatric/Behavioral: Negative.    All other systems reviewed and are negative.    Labs/Other Tests and Data Reviewed:    Recent Labs: 02/23/2021: ALT 23; BUN 12; Creatinine, Ser 0.83; Hemoglobin 14.5; Magnesium 1.8; Platelets 291; Potassium 3.9; Sodium 137; TSH 1.013   Recent Lipid Panel No results found for: CHOL, TRIG, HDL, CHOLHDL, LDLCALC, LDLDIRECT  Wt Readings from Last 3 Encounters:  06/21/21 137 lb 4 oz (62.3 kg)  02/23/21 145 lb (65.8 kg)  12/07/20 138 lb (62.6 kg)     Exam:    Vital Signs: Vital signs as detailed above in HPI  Well nourished, well developed male in no acute distress. Constitutional:  oriented to person, place, and time. No distress.  Head: Normocephalic and atraumatic.  Eyes:  no discharge. No scleral icterus.  Neck: Normal range  of motion. Neck supple.  Pulmonary/Chest: No audible wheezing, no distress, appears comfortable Musculoskeletal: Normal range of motion.  no  tenderness or deformity.  Neurological:   Coordination normal. Full exam not performed Skin:  No rash Psychiatric:  normal mood and affect. behavior is normal. Thought content normal.     ASSESSMENT & PLAN:     PAD (peripheral artery disease) (HCC) Very mild carotid disease  Continue statin, no further imaging needed  stenosis of carotid artery, unspecified laterality -  Previously less than 40% over 6 years ago No further studies needed at this time Goal LDL less than 70  Amaurosis fugax of right eye On aspirin, statin  Cigarette smoker Smoking cessation recommended  Coronary artery disease Unclear history, nothing in the [facility] records but reports he was taken by EMS to the hospital for chest pain, was given nitro, He reports having cardiac catheterization but nothing in the system Currently asymptomatic    Total encounter time more than 25 minutes  Greater than 50% was spent in counseling and coordination of care with the patient  Signed, Ida Rogue, MD  06/21/2021 9:50 AM    [place] Office [address], [place], [place] [address]

## 2021-06-21 NOTE — Patient Instructions (Addendum)
Medication Instructions:  No changes  If you need a refill on your cardiac medications before your next appointment, please call your pharmacy.   Lab work: No new labs needed  Testing/Procedures: No new testing needed   Follow-Up: At [facility], you and your health needs are our priority.  As part of our continuing mission to provide you with exceptional heart care, we have created designated Provider Care Teams.  These Care Teams include your primary Cardiologist (physician) and Advanced Practice Providers (APPs -  Physician Assistants and Nurse Practitioners) who all work together to provide you with the care you need, when you need it.  You will need a follow up appointment as needed  Providers on your designated Care Team:   Murray Hodgkins, NP Christell Faith, PA-C Marrianne Mood, PA-C Cadence Kathlen Mody, [place]  Any Other Special Instructions Will Be Listed Below (If Applicable).  COVID-19 Vaccine Information can be found at: ShippingScam.co.uk For questions related to vaccine distribution or appointments, please email vaccine@[email or web address] .[email or web address] or call [phone].

## 2021-06-26 NOTE — Telephone Encounter (Signed)
Please call Kenneth Daniels and have him reschedule his missed appointment from April for a PSA and exam.  He has a prostate nodule that we need to monitor closely.  If the nodule should change or enlarge or his PSA increase, it may be a sign of prostate cancer.

## 2021-06-27 NOTE — Telephone Encounter (Signed)
Spoke with patient and advised results  Appt scheduled for lab and OV

## 2021-06-29 ENCOUNTER — Other Ambulatory Visit
Admission: RE | Admit: 2021-06-29 | Discharge: 2021-06-29 | Disposition: A | Discharge status: Home / Self Care | Payer: Medicare Other | Source: Ambulatory Visit | Attending: Urology | Admitting: Urology

## 2021-06-29 ENCOUNTER — Other Ambulatory Visit: Payer: Self-pay

## 2021-06-29 DIAGNOSIS — R972 Elevated prostate specific antigen [PSA]: Secondary | ICD-10-CM | POA: Diagnosis present

## 2021-06-29 LAB — PSA: Prostatic Specific Antigen: 1.13 ng/mL (ref 0.00–4.00)

## 2021-06-30 ENCOUNTER — Encounter: Payer: Self-pay | Admitting: Urology

## 2021-07-05 NOTE — Progress Notes (Deleted)
07/06/2021 3:24 PM   Kenneth Daniels July 07, 1956 [id number]  Referring provider: Theotis Burrow, MD [address] [place],  [place] [address]  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 Prostate nodule -PSA 1.13 05/2021 -prostate biopsy 08/2018 benign  3. BPH with LU TS -I PSS *** -PVR *** -managed with oxybutynin XL 15 mg daily   4. Family history of prostate cancer - cousin died of prostate cancer, age 58  No chief complaint on file.   HPI: Kenneth Daniels is a 65 y.o. male who presents today for follow up.      Score:  1-7 Mild 8-19 Moderate 20-35 Severe  PMH: Past Medical History:  Diagnosis Date   Absolute anemia 03/27/2012   Anxiety    Atypical chest pain 04/17/2016   CAD in native artery 04/17/2016   Cerebral infarction ([place]) 11/25/2013   Overview:  IMO Problem List Replacer Jan. 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   BACK SURGERY     CERVICAL DISC ARTHROPLASTY N/A 09/11/2017   Procedure: CERVICAL ANTERIOR DISC ARTHROPLASTY C3-4 POSSIBLE C4-5;  Surgeon: Meade Maw, MD;  Location: [facility] ORS;  Service: Neurosurgery;  Laterality: N/A;   COLON SURGERY     Colon Resection   HERNIA REPAIR     HOLMIUM LASER  APPLICATION N/A 21/22/4825   Procedure: HOLMIUM LASER APPLICATION;  Surgeon: Hollice Espy, MD;  Location: [facility] ORS;  Service: Urology;  Laterality: N/A;   PROSTATE ABLATION N/A 11/20/2016   Procedure: PROSTATE ABLATION;  Surgeon: Hollice Espy, MD;  Location: [facility] ORS;  Service: Urology;  Laterality: N/A;   SOFT TISSUE TUMOR RESECTION     left thigh   TRANSURETHRAL RESECTION OF PROSTATE N/A 11/20/2016   Procedure: TRANSURETHRAL RESECTION OF THE PROSTATE (TURP);  Surgeon: Hollice Espy, MD;  Location: [facility] ORS;  Service: Urology;  Laterality: N/A;    Home Medications:  Allergies as of 07/06/2021       Reactions   Oxycodone Other (See Comments)   Other reaction(s): Confusion "Had me all messed up" "Had me all messed up"   Gabapentin Other (See Comments)   Lisinopril    Sulfa Antibiotics Rash        Medication List        Accurate as of July 05, 2021  3:24 PM. If you have any questions, ask your nurse or doctor.          aspirin EC 81 MG tablet Take 1 tablet (81 mg total) by mouth daily.   cholecalciferol 1000 units tablet Commonly known as: VITAMIN D Take 1,000 Units by mouth daily.   Vitamin D-3 125 MCG (5000 UT) Tabs Take 5,000 Units by mouth daily.   citalopram 20 MG tablet Commonly known as: CELEXA   ondansetron  4 MG tablet Commonly known as: Zofran Take 1 tablet (4 mg total) by mouth every 8 (eight) hours as needed for up to 10 doses for nausea or vomiting.   oxybutynin 15 MG 24 hr tablet Commonly known as: DITROPAN XL TAKE 1 TABLET BY MOUTH DAILY   phenytoin 100 MG ER capsule Commonly known as: DILANTIN TAKE 3 CAPSULES IN THE MORNING AND 2 CAPSULES EVERY EVENING   phenytoin 50 MG tablet Commonly known as: DILANTIN   pregabalin 75 MG capsule Commonly known as: LYRICA Take by mouth.   simvastatin 20 MG tablet Commonly known as: ZOCOR TAKE 1 TABLET EVERY EVENING FOR HIGH CHOLESTEROL        Allergies:  Allergies  Allergen Reactions    Oxycodone Other (See Comments)    Other reaction(s): Confusion "Had me all messed up" "Had me all messed up"   Gabapentin Other (See Comments)   Lisinopril    Sulfa Antibiotics Rash    Family History: Family History  Problem Relation Age of Onset   Bladder Cancer Neg Hx    Prostate cancer Neg Hx    Kidney cancer Neg Hx     Social History:  reports that he quit smoking about 13 years ago. His smoking use included cigarettes. He has a 28.50 pack-year smoking history. He has never used smokeless tobacco. He reports current drug use. Drug: Marijuana. He reports that he does not drink alcohol.  ROS: For pertinent review of systems please refer to history of present illness  Physical Exam: There were no vitals taken for this visit.  Constitutional:  Well nourished. Alert and oriented, No acute distress. HEENT: [place] AT, moist mucus membranes.  Trachea midline Cardiovascular: No clubbing, cyanosis, or edema. Respiratory: Normal respiratory effort, no increased work of breathing. GI: Abdomen is soft, non tender, non distended, no abdominal masses. Liver and spleen not palpable.  No hernias appreciated.  Stool sample for occult testing is not indicated.   GU: No CVA tenderness.  No bladder fullness or masses.  Patient with circumcised/uncircumcised phallus. ***Foreskin easily retracted***  Urethral meatus is patent.  No penile discharge. No penile lesions or rashes. Scrotum without lesions, cysts, rashes and/or edema.  Testicles are located scrotally bilaterally. No masses are appreciated in the testicles. Left and right epididymis are normal. Rectal: Patient with  normal sphincter tone. Anus and perineum without scarring or rashes. No rectal masses are appreciated. Prostate is approximately *** grams, *** nodules are appreciated. Seminal vesicles are normal. Skin: No rashes, bruises or suspicious lesions. Lymph: No inguinal adenopathy. Neurologic: Grossly intact, no focal deficits, moving all 4  extremities. Psychiatric: Normal mood and affect.   Laboratory Data: Lab Results  Component Value Date   WBC 8.6 02/23/2021   HGB 14.5 02/23/2021   HCT 43.4 02/23/2021   MCV 91.0 02/23/2021   PLT 291 02/23/2021    Lab Results  Component Value Date   CREATININE 0.83 02/23/2021   Component     Latest Ref Rng & Units 02/23/2021  TSH     0.350 - 4.500 uIU/mL 1.013   I have reviewed the labs.  Pertinent imaging No recent imaging   Assessment & Plan:    1.  BPH with LUTS IPSS score is 12/2, it is stable Continue conservative management, avoiding bladder irritants and timed voiding's At goal with oxybutynin Continue oxybutynin XL 15 mg daily RTC in 6 months for PSA, I PSS, PVR and exam  2. Family history of prostate cancer Status post previous biopsy  which was negative RTC in 6 months for PSA and exam  3. Prostate nodule As above, likely BPH nodule   No follow-ups on file.  Zara Council, PA-C  [facility] [address] [place], [place] [address] [phone]

## 2021-07-06 ENCOUNTER — Ambulatory Visit: Payer: Self-pay | Admitting: Urology

## 2021-07-10 NOTE — Progress Notes (Signed)
07/11/2021 8:47 AM   Kenneth Daniels 1956-03-24 [id number]  Referring provider: Theotis Burrow, MD [address] [place],   [address]  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 Prostate nodule -PSA 1.13 05/2021 -prostate biopsy 08/2018 benign  3. BPH with LU TS -I PSS 19/2  4. Family history of prostate cancer - cousin died of prostate cancer, age 56  Chief Complaint  Patient presents with   Benign Prostatic Hypertrophy     HPI: Kenneth Daniels is a 65 y.o. male who presents today for follow up.  He has no urinary complaints at this visit.  Patient denies any modifying or aggravating factors.  Patient denies any gross hematuria, dysuria or suprapubic/flank pain.  Patient denies any fevers, chills, nausea or vomiting.      IPSS     Row Name 07/11/21 0800         International Prostate Symptom Score   How often have you had the sensation of not emptying your bladder? More than half the time     How often have you had to urinate less than every two hours? About half the time     How often have you found you stopped and started again several times when you urinated? About half the time     How often have you found it difficult to postpone urination? Less than half the time     How often have you had a weak urinary stream? About half the time     How often have you had to strain to start urination? Less than half the time     How many times did you typically get up at night to urinate? 2 Times     Total IPSS Score 19           Quality of Life due to urinary symptoms     If you were to spend the rest of your life with your urinary condition just the way it is now how would you feel about that? Mostly Satisfied              Score:  1-7 Mild 8-19 Moderate 20-35 Severe    Patient  still having spontaneous erections.   He denies any pain or curvature with erections.     SHIM     Row Name 07/11/21 0818         SHIM: Over the last 6 months:   How do you rate your confidence that you could get and keep an erection? Low     When you had erections with sexual stimulation, how often were your erections hard enough for penetration (entering your partner)? Sometimes (about half the time)     During sexual intercourse, how often were you able to maintain your erection after you had penetrated (entered) your partner? Sometimes (about half the time)     During sexual intercourse, how difficult was it to maintain your erection to completion of intercourse? Very Difficult     When you attempted sexual intercourse, how often was it satisfactory for you? Almost Never or Never           SHIM Total Score     SHIM 11             Score: 1-7 Severe ED 8-11 Moderate ED 12-16 Mild-Moderate ED 17-21 Mild  ED 22-25 No ED   PMH: Past Medical History:  Diagnosis Date   Absolute anemia 03/27/2012   Anxiety    Atypical chest pain 04/17/2016   CAD in native artery 04/17/2016   Cerebral infarction ([place]) 11/25/2013   Overview:  IMO Problem List Replacer Jan. 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   BACK SURGERY     CERVICAL DISC ARTHROPLASTY N/A 09/11/2017   Procedure: CERVICAL ANTERIOR DISC ARTHROPLASTY C3-4  POSSIBLE C4-5;  Surgeon: Meade Maw, MD;  Location: [facility] ORS;  Service: Neurosurgery;  Laterality: N/A;   COLON SURGERY     Colon Resection   HERNIA REPAIR     HOLMIUM LASER APPLICATION N/A 06/23/7627   Procedure: HOLMIUM LASER APPLICATION;  Surgeon: Hollice Espy, MD;  Location: [facility] ORS;  Service: Urology;  Laterality: N/A;   PROSTATE ABLATION N/A 11/20/2016   Procedure: PROSTATE ABLATION;  Surgeon: Hollice Espy, MD;  Location: [facility] ORS;  Service: Urology;  Laterality: N/A;   SOFT TISSUE TUMOR RESECTION     left thigh   TRANSURETHRAL RESECTION OF PROSTATE N/A 11/20/2016   Procedure: TRANSURETHRAL RESECTION OF THE PROSTATE (TURP);  Surgeon: Hollice Espy, MD;  Location: [facility] ORS;  Service: Urology;  Laterality: N/A;    Home Medications:  Allergies as of 07/11/2021       Reactions   Oxycodone Other (See Comments)   Other reaction(s): Confusion "Had me all messed up" "Had me all messed up"   Gabapentin Other (See Comments)   Lisinopril    Sulfa Antibiotics Rash        Medication List        Accurate as of July 11, 2021  8:47 AM. If you have any questions, ask your nurse or doctor.          STOP taking these medications    ondansetron 4 MG tablet Commonly known as: Zofran Stopped by: Zamiah Tollett, PA-C   oxybutynin 15 MG 24 hr tablet Commonly known as: DITROPAN XL Stopped by: Zara Council, PA-C       TAKE these medications    aspirin EC 81 MG tablet Take 1 tablet (81 mg total) by mouth daily.   citalopram 20 MG tablet Commonly known as: CELEXA   phenytoin 100 MG ER capsule Commonly known as: DILANTIN TAKE 3 CAPSULES IN THE MORNING AND 2 CAPSULES EVERY EVENING   phenytoin 50 MG tablet Commonly known as: DILANTIN   pregabalin 75 MG capsule Commonly known as: LYRICA Take by mouth.   sildenafil 100 MG tablet Commonly known as: Viagra Take 1 tablet (100 mg total) by mouth daily as needed for erectile dysfunction. Started by: Zara Council, PA-C   simvastatin 20 MG tablet Commonly known as: ZOCOR TAKE 1 TABLET EVERY EVENING FOR HIGH CHOLESTEROL   Vitamin D-3 125 MCG (5000 UT) Tabs Take 5,000 Units by mouth daily. What changed: Another medication with the same name was  removed. Continue taking this medication, and follow the directions you see here. Changed by: Zara Council, PA-C        Allergies:  Allergies  Allergen Reactions   Oxycodone Other (See Comments)    Other reaction(s): Confusion "Had me all messed up" "Had me all messed up"   Gabapentin Other (See Comments)   Lisinopril    Sulfa Antibiotics Rash    Family History: Family History  Problem Relation Age of Onset   Bladder Cancer Neg Hx    Prostate cancer Neg Hx    Kidney cancer Neg Hx     Social History:  reports that he quit smoking about 13 years ago. His smoking use included cigarettes. He has a 28.50 pack-year smoking history. He has never used smokeless tobacco. He reports current drug use. Drug: Marijuana. He reports that he does not drink alcohol.  ROS: For pertinent review of systems please refer to history of present illness  Physical Exam: BP 104/68   Pulse (!) 57   Ht 5\' 10"  (1.778 m)   Wt 137 lb (62.1 kg)   BMI 19.66 kg/m   Constitutional:  Well nourished. Alert and oriented, No acute distress. HEENT: [place] AT, mask in place  Trachea midline Cardiovascular: No clubbing, cyanosis, or edema. Respiratory: Normal respiratory effort, no increased work of breathing. GU: No CVA tenderness.  No bladder fullness or masses.  Patient with uncircumcised phallus. Foreskin easily retracted  Urethral meatus is patent.  No penile discharge. No penile lesions or rashes. Scrotum without lesions, cysts, rashes and/or edema.  Testicles are located scrotally bilaterally. No masses are appreciated in the testicles. Left and right epididymis are normal. Rectal: Patient with  normal sphincter tone. Anus and perineum without scarring or rashes.  No rectal masses are appreciated. Prostate is approximately 40 grams, stable mid/apical nodules < 1 cm in size, unchanged is appreciated. Seminal vesicles could not be palpated Neurologic: Grossly intact, no focal deficits, moving all 4 extremities. Psychiatric: Normal mood and affect.   Laboratory Data: Lab Results  Component Value Date   WBC 8.6 02/23/2021   HGB 14.5 02/23/2021   HCT 43.4 02/23/2021   MCV 91.0 02/23/2021   PLT 291 02/23/2021    Lab Results  Component Value Date   CREATININE 0.83 02/23/2021   Component     Latest Ref Rng & Units 02/23/2021  TSH     0.350 - 4.500 uIU/mL 1.013   I have reviewed the labs.  Pertinent imaging No recent imaging   Assessment & Plan:    1.  BPH with LUTS -stable  2. Family history of prostate cancer -prostate biopsy negative -PSA stable -nodule stable  3. Prostate nodule As above, likely BPH nodule  4. ED -he would like to try a PDE5i at this time -Sildenafil 100 mg, 1 tablet two hours prior to intercourse on an empty stomach, # 83; he is warned not to take medications that contain nitrates.  I also advised him of the side effects, such as: headache, flushing, dyspepsia, abnormal vision, nasal congestion, back pain, myalgia, nausea, dizziness, and rash.    Return in about 6 months (around 01/11/2022) for IPSS, SHIM, PSA and exam.  Zara Council, [facility]  [place] [address] [place], [place] [address] [phone]

## 2021-07-11 ENCOUNTER — Other Ambulatory Visit: Payer: Self-pay

## 2021-07-11 ENCOUNTER — Ambulatory Visit (INDEPENDENT_AMBULATORY_CARE_PROVIDER_SITE_OTHER): Payer: Medicare Other | Admitting: Urology

## 2021-07-11 ENCOUNTER — Encounter: Payer: Self-pay | Admitting: Urology

## 2021-07-11 VITALS — BP 104/68 | HR 57 | Ht 70.0 in | Wt 137.0 lb

## 2021-07-11 DIAGNOSIS — Z8042 Family history of malignant neoplasm of prostate: Secondary | ICD-10-CM | POA: Diagnosis not present

## 2021-07-11 DIAGNOSIS — N402 Nodular prostate without lower urinary tract symptoms: Secondary | ICD-10-CM

## 2021-07-11 DIAGNOSIS — N529 Male erectile dysfunction, unspecified: Secondary | ICD-10-CM

## 2021-07-11 DIAGNOSIS — N138 Other obstructive and reflux uropathy: Secondary | ICD-10-CM

## 2021-07-11 DIAGNOSIS — N401 Enlarged prostate with lower urinary tract symptoms: Secondary | ICD-10-CM | POA: Diagnosis not present

## 2021-07-11 MED ORDER — SILDENAFIL CITRATE 100 MG PO TABS: 100.0000 mg | ORAL_TABLET | Freq: Every day | ORAL | 3 refills | Status: DC | PRN

## 2021-09-18 ENCOUNTER — Other Ambulatory Visit: Payer: Self-pay | Admitting: Urology

## 2021-09-18 DIAGNOSIS — N138 Other obstructive and reflux uropathy: Secondary | ICD-10-CM

## 2021-09-18 DIAGNOSIS — N402 Nodular prostate without lower urinary tract symptoms: Secondary | ICD-10-CM

## 2021-10-05 ENCOUNTER — Other Ambulatory Visit: Payer: Self-pay | Admitting: Family Medicine

## 2021-10-05 ENCOUNTER — Other Ambulatory Visit (HOSPITAL_BASED_OUTPATIENT_CLINIC_OR_DEPARTMENT_OTHER): Payer: Self-pay | Admitting: Family Medicine

## 2021-10-05 DIAGNOSIS — Z87891 Personal history of nicotine dependence: Secondary | ICD-10-CM

## 2021-10-16 ENCOUNTER — Ambulatory Visit: Admission: RE | Admit: 2021-10-16 | Payer: Medicare Other | Source: Ambulatory Visit

## 2021-10-17 ENCOUNTER — Ambulatory Visit: Payer: Medicare Other

## 2022-01-09 ENCOUNTER — Other Ambulatory Visit: Payer: Self-pay

## 2022-01-09 ENCOUNTER — Other Ambulatory Visit: Payer: Commercial Managed Care - HMO

## 2022-01-09 DIAGNOSIS — N401 Enlarged prostate with lower urinary tract symptoms: Secondary | ICD-10-CM

## 2022-01-09 DIAGNOSIS — N138 Other obstructive and reflux uropathy: Secondary | ICD-10-CM

## 2022-01-09 DIAGNOSIS — N402 Nodular prostate without lower urinary tract symptoms: Secondary | ICD-10-CM

## 2022-01-10 LAB — PSA: Prostate Specific Ag, Serum: 1.9 ng/mL (ref 0.0–4.0)

## 2022-01-10 NOTE — Progress Notes (Deleted)
01/11/2022 7:35 AM   Kenneth Daniels 1956-10-25 [id number]  Referring provider: Theotis Burrow, MD [address] [place],  [place] [address]  No chief complaint on file.   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 Prostate nodule -PSA 1.9 12/2021 -prostate biopsy 08/2018 benign  3. BPH with LU TS -I PSS ***  4. ED -Contributing factors of age, BPH, stroke, myocardial infarction, CAD, PAD, hyperlipidemia, depression and smoking -SHIM *** -Managed with sildenafil 100 mg, on demand dosing  5. Family history of prostate cancer - cousin died of prostate cancer, age 4     HPI: Kenneth Daniels is a 66 y.o. male who presents today for 6 month follow up.           Score:  1-7 Mild 8-19 Moderate 20-35 Severe         Score: 1-7 Severe ED 8-11 Moderate ED 12-16 Mild-Moderate ED 17-21 Mild ED 22-25 No ED   PMH: Past Medical History:  Diagnosis Date   Absolute anemia 03/27/2012   Anxiety    Atypical chest pain 04/17/2016   CAD in native artery 04/17/2016   Cerebral infarction ([place]) 11/25/2013   Overview:  IMO Problem List Replacer Jan. 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   BACK SURGERY     CERVICAL DISC  ARTHROPLASTY N/A 09/11/2017   Procedure: CERVICAL ANTERIOR DISC ARTHROPLASTY C3-4 POSSIBLE C4-5;  Surgeon: Meade Maw, MD;  Location: [facility] ORS;  Service: Neurosurgery;  Laterality: N/A;   COLON SURGERY     Colon Resection   HERNIA REPAIR     HOLMIUM LASER APPLICATION N/A 19/37/9024   Procedure: HOLMIUM LASER APPLICATION;  Surgeon: Hollice Espy, MD;  Location: [facility] ORS;  Service: Urology;  Laterality: N/A;   PROSTATE ABLATION N/A 11/20/2016   Procedure: PROSTATE ABLATION;  Surgeon: Hollice Espy, MD;  Location: [facility] ORS;  Service: Urology;  Laterality: N/A;   SOFT TISSUE TUMOR RESECTION     left thigh   TRANSURETHRAL RESECTION OF PROSTATE N/A 11/20/2016   Procedure: TRANSURETHRAL RESECTION OF THE PROSTATE (TURP);  Surgeon: Hollice Espy, MD;  Location: [facility] ORS;  Service: Urology;  Laterality: N/A;    Home Medications:  Allergies as of 01/11/2022       Reactions   Oxycodone Other (See Comments)   Other reaction(s): Confusion "Had me all messed up" "Had me all messed up"   Gabapentin Other (See Comments)   Lisinopril    Sulfa Antibiotics Rash        Medication List        Accurate as of January 10, 2022  7:35 AM. If you have any questions, ask your nurse or doctor.          aspirin EC 81 MG tablet Take 1  tablet (81 mg total) by mouth daily.   citalopram 20 MG tablet Commonly known as: CELEXA   phenytoin 100 MG ER capsule Commonly known as: DILANTIN TAKE 3 CAPSULES IN THE MORNING AND 2 CAPSULES EVERY EVENING   phenytoin 50 MG tablet Commonly known as: DILANTIN   pregabalin 75 MG capsule Commonly known as: LYRICA Take by mouth.   sildenafil 100 MG tablet Commonly known as: Viagra Take 1 tablet (100 mg total) by mouth daily as needed for erectile dysfunction.   simvastatin 20 MG tablet Commonly known as: ZOCOR TAKE 1 TABLET EVERY EVENING FOR HIGH CHOLESTEROL   Vitamin D-3 125 MCG (5000 UT) Tabs Take 5,000 Units by mouth daily.         Allergies:  Allergies  Allergen Reactions   Oxycodone Other (See Comments)    Other reaction(s): Confusion "Had me all messed up" "Had me all messed up"   Gabapentin Other (See Comments)   Lisinopril    Sulfa Antibiotics Rash    Family History: Family History  Problem Relation Age of Onset   Bladder Cancer Neg Hx    Prostate cancer Neg Hx    Kidney cancer Neg Hx     Social History:  reports that he quit smoking about 14 years ago. His smoking use included cigarettes. He has a 28.50 pack-year smoking history. He has never used smokeless tobacco. He reports current drug use. Drug: Marijuana. He reports that he does not drink alcohol.  ROS: For pertinent review of systems please refer to history of present illness  Physical Exam: There were no vitals taken for this visit.  Constitutional:  Well nourished. Alert and oriented, No acute distress. HEENT: [place] AT, moist mucus membranes.  Trachea midline Cardiovascular: No clubbing, cyanosis, or edema. Respiratory: Normal respiratory effort, no increased work of breathing. GU: No CVA tenderness.  No bladder fullness or masses.  Patient with circumcised/uncircumcised phallus. ***Foreskin easily retracted***  Urethral meatus is patent.  No penile discharge. No penile lesions or rashes. Scrotum without lesions, cysts, rashes and/or edema.  Testicles are located scrotally bilaterally. No masses are appreciated in the testicles. Left and right epididymis are normal. Rectal: Patient with  normal sphincter tone. Anus and perineum without scarring or rashes. No rectal masses are appreciated. Prostate is approximately *** grams, *** nodules are appreciated. Seminal vesicles are normal. Neurologic: Grossly intact, no focal deficits, moving all 4 extremities. Psychiatric: Normal mood and affect.   Laboratory Data: Component     Latest Ref Rng & Units 07/12/2017 07/09/2018 09/14/2019 10/13/2020  Prostate Specific Ag, Serum     0.0 - 4.0 ng/mL 1.2  1.6 1.4 1.7   Component     Latest Ref Rng & Units 01/09/2022  Prostate Specific Ag, Serum     0.0 - 4.0 ng/mL 1.9  I have reviewed the labs.  Pertinent imaging N/A  Assessment & Plan:    1.  BPH with LUTS -PSA stable -DRE benign -UA benign -PVR < 300 cc -symptoms - *** -most bothersome symptoms are *** -continue conservative management, avoiding bladder irritants and timed voiding's -Initiate alpha-blocker (***), discussed side effects *** -Initiate 5 alpha reductase inhibitor (***), discussed side effects *** -Continue tamsulosin 0.4 mg daily, alfuzosin 10 mg daily, Rapaflo 8 mg daily, terazosin, doxazosin, Cialis 5 mg daily and finasteride 5 mg daily, dutasteride 0.5 mg daily***:refills given -Cannot tolerate medication or medication failure, schedule cystoscopy ***  2. Prostate nodule ***   3. Family history of prostate cancer -see #1 and #  2  4. ED -***  No follow-ups on file.  Zara Council, PA-C  [facility] [address] [place], [place] [address] [phone]

## 2022-01-11 ENCOUNTER — Ambulatory Visit: Payer: Self-pay | Admitting: Urology

## 2022-01-11 ENCOUNTER — Encounter: Payer: Self-pay | Admitting: Urology

## 2022-01-11 DIAGNOSIS — Z8042 Family history of malignant neoplasm of prostate: Secondary | ICD-10-CM

## 2022-01-11 DIAGNOSIS — N402 Nodular prostate without lower urinary tract symptoms: Secondary | ICD-10-CM

## 2022-01-11 DIAGNOSIS — N138 Other obstructive and reflux uropathy: Secondary | ICD-10-CM

## 2022-01-11 DIAGNOSIS — N529 Male erectile dysfunction, unspecified: Secondary | ICD-10-CM

## 2022-02-14 NOTE — Progress Notes (Signed)
02/15/2022 9:13 AM   Kenneth Daniels 01-Apr-1956 [id number]  Referring provider: Theotis Burrow, MD [address] [place],  [place] [address]  Chief Complaint  Patient presents with   Benign Prostatic Hypertrophy    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 Prostate nodule -PSA 1.9 12/2021 -prostate biopsy 08/2018 benign  3. BPH with LU TS -I PSS 15/3  4. Family history of prostate cancer - cousin died of prostate cancer, age 25  5. ED -contributing factors of age, history of smoking, MI, stroke, CAD, PAD, BPH, HTN, HLD, depression and anxiety -SHIM 15 -managed with sildenafil 100 mg, on-demand-dosing  HPI: Kenneth Daniels is a 66 y.o. male who presents today for 6 month follow up.  He has urinary complaints of frequency and incontinence.  The incontinence is a post-void dribbling.  Patient denies any modifying or aggravating factors.  Patient denies any gross hematuria, dysuria or suprapubic/flank pain.  Patient denies any fevers, chills, nausea or vomiting.     IPSS     Row Name 02/15/22 0900         International Prostate Symptom Score   How often have you had the sensation of not emptying your bladder? More than half the time     How often have you had to urinate less than every two hours? About half the time     How often have you found you stopped and started again several times when you urinated? Less than half the time     How often have you found it difficult to postpone urination? Less than half the time     How often have you had a weak urinary stream? Less than 1 in 5 times     How often have you had to strain to start urination? Less than 1 in 5 times     How many times did you typically get up at night to urinate? 2 Times     Total IPSS Score 15       Quality of Life due to urinary  symptoms   If you were to spend the rest of your life with your urinary condition just the way it is now how would you feel about that? Mixed                Score:  1-7 Mild 8-19 Moderate 20-35 Severe  Patient still having spontaneous erections.  He denies any pain or curvature with erections.  He is not very sexually active at this time.     SHIM     Row Name 02/15/22 0913         SHIM: Over the last 6 months:   How do you rate your confidence that you could get and keep an erection? Moderate     When you had erections with sexual stimulation, how often were your erections hard enough for penetration (entering your partner)? Sometimes (about half the time)     During sexual intercourse, how often were you able to maintain your erection after you had penetrated (entered) your partner? Sometimes (about half the time)     During sexual intercourse, how difficult was it to maintain your erection to completion of intercourse? Slightly Difficult     When you attempted sexual intercourse, how often was it satisfactory for you? A Few Times (much less than  half the time)       SHIM Total Score   SHIM 15               Score: 1-7 Severe ED 8-11 Moderate ED 12-16 Mild-Moderate ED 17-21 Mild ED 22-25 No ED   PMH: Past Medical History:  Diagnosis Date   Absolute anemia 03/27/2012   Anxiety    Atypical chest pain 04/17/2016   CAD in native artery 04/17/2016   Cerebral infarction ([place]) 11/25/2013   Overview:  IMO Problem List Replacer Jan. 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   BACK SURGERY     CERVICAL DISC ARTHROPLASTY N/A 09/11/2017   Procedure: CERVICAL ANTERIOR DISC ARTHROPLASTY C3-4 POSSIBLE C4-5;  Surgeon: Meade Maw, MD;  Location: [facility] ORS;  Service: Neurosurgery;  Laterality: N/A;   COLON SURGERY     Colon Resection   HERNIA REPAIR     HOLMIUM LASER APPLICATION N/A 27/78/2423   Procedure: HOLMIUM LASER APPLICATION;  Surgeon: Hollice Espy, MD;  Location: [facility] ORS;  Service: Urology;  Laterality: N/A;   PROSTATE ABLATION N/A 11/20/2016   Procedure: PROSTATE ABLATION;  Surgeon: Hollice Espy, MD;  Location: [facility] ORS;  Service: Urology;  Laterality: N/A;   SOFT TISSUE TUMOR RESECTION     left thigh   TRANSURETHRAL RESECTION OF PROSTATE N/A 11/20/2016   Procedure: TRANSURETHRAL RESECTION OF THE PROSTATE (TURP);  Surgeon: Hollice Espy, MD;  Location: [facility] ORS;  Service: Urology;  Laterality: N/A;    Home Medications:  Allergies as of 02/15/2022       Reactions   Oxycodone Other (See Comments)   Other reaction(s): Confusion "Had me all messed up" "Had me all messed up"   Gabapentin Other (See Comments)   Lisinopril    Sulfa Antibiotics Rash        Medication List        Accurate as of February 15, 2022  9:13 AM. If you have any questions, ask your nurse or doctor.          aspirin EC 81 MG tablet Take 1 tablet (81 mg total) by mouth daily.   citalopram 20 MG tablet Commonly known as: CELEXA   phenytoin 100 MG ER capsule Commonly known as: DILANTIN TAKE 3 CAPSULES IN THE MORNING AND 2 CAPSULES EVERY EVENING   phenytoin 50 MG tablet Commonly known as: DILANTIN   pregabalin 75 MG capsule Commonly known as: LYRICA Take by mouth.   sildenafil 100 MG tablet Commonly known as: Viagra Take 1 tablet (100 mg total) by mouth daily as needed for  erectile dysfunction.   simvastatin 20 MG tablet Commonly known as: ZOCOR TAKE 1 TABLET EVERY EVENING FOR HIGH CHOLESTEROL   Vitamin D-3 125 MCG (5000 UT) Tabs Take 5,000 Units by mouth daily.        Allergies:  Allergies  Allergen Reactions   Oxycodone Other (See Comments)  Other reaction(s): Confusion "Had me all messed up" "Had me all messed up"   Gabapentin Other (See Comments)   Lisinopril    Sulfa Antibiotics Rash    Family History: Family History  Problem Relation Age of Onset   Bladder Cancer Neg Hx    Prostate cancer Neg Hx    Kidney cancer Neg Hx     Social History:  reports that he quit smoking about 14 years ago. His smoking use included cigarettes. He has a 28.50 pack-year smoking history. He has never used smokeless tobacco. He reports current drug use. Drug: Marijuana. He reports that he does not drink alcohol.  ROS: For pertinent review of systems please refer to history of present illness  Physical Exam: BP (!) 147/85    Pulse (!) 58    Ht 5\' 10"  (1.778 m)    Wt 137 lb (62.1 kg)    BMI 19.66 kg/m   Constitutional:  Well nourished. Alert and oriented, No acute distress. HEENT: [place] AT, mask in place trachea midline Cardiovascular: No clubbing, cyanosis, or edema. Respiratory: Normal respiratory effort, no increased work of breathing. GU: No CVA tenderness.  No bladder fullness or masses.  Patient with uncircumcised phallus.  Foreskin easily retracted  Urethral meatus is patent.  No penile discharge. No penile lesions or rashes. Scrotum without lesions, cysts, rashes and/or edema.  Testicles are located scrotally bilaterally. No masses are appreciated in the testicles. Left and right epididymis are normal. Rectal: Patient with  normal sphincter tone. Anus and perineum without scarring or rashes. No rectal masses are appreciated. Prostate is approximately 50 grams, no nodules are appreciated. Seminal vesicles could not be palpated Neurologic: Grossly  intact, no focal deficits, moving all 4 extremities. Psychiatric: Normal mood and affect.   Laboratory Data: Component     Latest Ref Rng & Units 07/12/2017 07/09/2018 09/14/2019 10/13/2020  Prostate Specific Ag, Serum     0.0 - 4.0 ng/mL 1.2 1.6 1.4 1.7   Component     Latest Ref Rng & Units 01/09/2022  Prostate Specific Ag, Serum     0.0 - 4.0 ng/mL 1.9  I have reviewed the labs.  Pertinent imaging No recent imaging   Assessment & Plan:    1.  BPH with LUTS -PSA stable -DRE benign -most bothersome symptoms are frequency and postvoid dribbling -continue conservative management, avoiding bladder irritants and timed voiding's -He does not want to start a medication for this or undergo any further studies at this time -Advised to push up on his perineum after voiding to aid in emptying to decrease postvoid dribbling   2. Family history of prostate cancer -prostate biopsy negative 2019 -PSA stable -Nodule not palpated on today's exam  3. Prostate nodule -Not appreciated on today's exam  4. ED -Continue sildenafil 100 mg, on-demand dosing  No follow-ups on file.  Zara Council, PA-C  [facility] [address] [place], [place] [address] [phone]

## 2022-02-15 ENCOUNTER — Ambulatory Visit (INDEPENDENT_AMBULATORY_CARE_PROVIDER_SITE_OTHER): Payer: Medicare Other | Admitting: Urology

## 2022-02-15 ENCOUNTER — Encounter: Payer: Self-pay | Admitting: Urology

## 2022-02-15 ENCOUNTER — Other Ambulatory Visit: Payer: Self-pay

## 2022-02-15 VITALS — BP 147/85 | HR 58 | Ht 70.0 in | Wt 137.0 lb

## 2022-02-15 DIAGNOSIS — N138 Other obstructive and reflux uropathy: Secondary | ICD-10-CM | POA: Diagnosis not present

## 2022-02-15 DIAGNOSIS — N401 Enlarged prostate with lower urinary tract symptoms: Secondary | ICD-10-CM | POA: Diagnosis not present

## 2022-02-15 DIAGNOSIS — N529 Male erectile dysfunction, unspecified: Secondary | ICD-10-CM | POA: Diagnosis not present

## 2022-02-15 DIAGNOSIS — N402 Nodular prostate without lower urinary tract symptoms: Secondary | ICD-10-CM | POA: Diagnosis not present

## 2022-03-07 ENCOUNTER — Ambulatory Visit: Admission: RE | Admit: 2022-03-07 | Payer: Medicare Other | Source: Home / Self Care | Admitting: Internal Medicine

## 2022-03-07 ENCOUNTER — Encounter: Admission: RE | Payer: Self-pay | Source: Home / Self Care

## 2022-03-07 SURGERY — COLONOSCOPY WITH PROPOFOL
Anesthesia: General

## 2022-03-15 ENCOUNTER — Telehealth: Payer: Self-pay | Admitting: Acute Care

## 2022-03-15 NOTE — Telephone Encounter (Signed)
Left voicemail with call back number for patient to call to schedule annual LDCT  

## 2022-07-28 ENCOUNTER — Emergency Department: Payer: No Typology Code available for payment source

## 2022-07-28 ENCOUNTER — Encounter: Payer: Self-pay | Admitting: Emergency Medicine

## 2022-07-28 ENCOUNTER — Emergency Department
Admission: EM | Admit: 2022-07-28 | Discharge: 2022-07-28 | Disposition: A | Discharge status: Home / Self Care | Payer: No Typology Code available for payment source | Attending: Emergency Medicine | Admitting: Emergency Medicine

## 2022-07-28 DIAGNOSIS — S39012A Strain of muscle, fascia and tendon of lower back, initial encounter: Secondary | ICD-10-CM | POA: Insufficient documentation

## 2022-07-28 DIAGNOSIS — S199XXA Unspecified injury of neck, initial encounter: Secondary | ICD-10-CM | POA: Diagnosis present

## 2022-07-28 DIAGNOSIS — Y92410 Unspecified street and highway as the place of occurrence of the external cause: Secondary | ICD-10-CM | POA: Diagnosis not present

## 2022-07-28 DIAGNOSIS — S161XXA Strain of muscle, fascia and tendon at neck level, initial encounter: Secondary | ICD-10-CM

## 2022-07-28 DIAGNOSIS — R519 Headache, unspecified: Secondary | ICD-10-CM | POA: Insufficient documentation

## 2022-07-28 MED ORDER — MELOXICAM 7.5 MG PO TABS: 7.5000 mg | ORAL_TABLET | Freq: Every day | ORAL | 0 refills | Status: DC

## 2022-07-28 MED ORDER — METHOCARBAMOL 500 MG PO TABS: 500.0000 mg | ORAL_TABLET | Freq: Four times a day (QID) | ORAL | 0 refills | Status: DC

## 2022-07-28 MED ORDER — MELOXICAM 7.5 MG PO TABS
7.5000 mg | ORAL_TABLET | Freq: Once | ORAL | Status: DC
Start: 2022-07-28 — End: 2022-07-29
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
## 2022-07-28 NOTE — ED Notes (Signed)
Patient refused med administration because he had to leave as soon as possible

## 2022-08-06 ENCOUNTER — Other Ambulatory Visit: Payer: Self-pay

## 2022-08-06 DIAGNOSIS — Z8042 Family history of malignant neoplasm of prostate: Secondary | ICD-10-CM

## 2022-08-06 DIAGNOSIS — N138 Other obstructive and reflux uropathy: Secondary | ICD-10-CM

## 2022-08-06 DIAGNOSIS — R972 Elevated prostate specific antigen [PSA]: Secondary | ICD-10-CM

## 2022-08-10 ENCOUNTER — Other Ambulatory Visit: Payer: Medicare Other

## 2022-08-10 DIAGNOSIS — N138 Other obstructive and reflux uropathy: Secondary | ICD-10-CM

## 2022-08-10 DIAGNOSIS — Z8042 Family history of malignant neoplasm of prostate: Secondary | ICD-10-CM

## 2022-08-10 DIAGNOSIS — R972 Elevated prostate specific antigen [PSA]: Secondary | ICD-10-CM

## 2022-08-11 LAB — PSA: Prostate Specific Ag, Serum: 1.4 ng/mL (ref 0.0–4.0)

## 2022-08-14 NOTE — Progress Notes (Unsigned)
08/15/2022 4:07 PM   Kenneth Daniels [birth year] [id number]  Referring provider: Theotis Burrow, MD [address] [place],  [place] [address]  No chief complaint on file.   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 Prostate nodule -PSA, 07/2022 - 1.4 -prostate biopsy 08/2018 benign  3. BPH with LU TS -I PSS *** -PVR ***  4. Family history of prostate cancer - cousin died of prostate cancer, age 62  5. ED -contributing factors of age, history of smoking, MI, stroke, CAD, PAD, BPH, HTN, HLD, depression and anxiety -SHIM *** -managed with sildenafil 100 mg, on-demand-dosing  HPI: Kenneth Daniels is a 66 y.o. male who presents today for 6 month follow up.        Score:  1-7 Mild 8-19 Moderate 20-35 Severe          Score: 1-7 Severe ED 8-11 Moderate ED 12-16 Mild-Moderate ED 17-21 Mild ED 22-25 No ED   PMH: Past Medical History:  Diagnosis Date   Absolute anemia 03/27/2012   Anxiety    Atypical chest pain 04/17/2016   CAD in native artery 04/17/2016   Cerebral infarction ([place]) 11/25/2013   Overview:  IMO Problem List Replacer Jan. 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   BACK SURGERY     CERVICAL DISC ARTHROPLASTY  N/A 09/11/2017   Procedure: CERVICAL ANTERIOR DISC ARTHROPLASTY C3-4 POSSIBLE C4-5;  Surgeon: Meade Maw, MD;  Location: [facility] ORS;  Service: Neurosurgery;  Laterality: N/A;   COLON SURGERY     Colon Resection   HERNIA REPAIR     HOLMIUM LASER APPLICATION N/A 83/15/1761   Procedure: HOLMIUM LASER APPLICATION;  Surgeon: Hollice Espy, MD;  Location: [facility] ORS;  Service: Urology;  Laterality: N/A;   PROSTATE ABLATION N/A 11/20/2016   Procedure: PROSTATE ABLATION;  Surgeon: Hollice Espy, MD;  Location: [facility] ORS;  Service: Urology;  Laterality: N/A;   SOFT TISSUE TUMOR RESECTION     left thigh   TRANSURETHRAL RESECTION OF PROSTATE N/A 11/20/2016   Procedure: TRANSURETHRAL RESECTION OF THE PROSTATE (TURP);  Surgeon: Hollice Espy, MD;  Location: [facility] ORS;  Service: Urology;  Laterality: N/A;    Home Medications:  Allergies as of 08/15/2022       Reactions   Oxycodone Other (See Comments)   Other reaction(s): Confusion "Had me all messed up" "Had me all messed up"   Gabapentin Other (See Comments)   Lisinopril    Sulfa Antibiotics Rash        Medication List        Accurate as of August 14, 2022  4:07 PM. If you have any questions, ask your nurse or doctor.          aspirin EC 81 MG tablet Take 1 tablet (  81 mg total) by mouth daily.   celecoxib 100 MG capsule Commonly known as: CELEBREX Take 100 mg by mouth 2 (two) times daily.   citalopram 20 MG tablet Commonly known as: CELEXA   meloxicam 7.5 MG tablet Commonly known as: Mobic Take 1 tablet (7.5 mg total) by mouth daily.   methocarbamol 500 MG tablet Commonly known as: ROBAXIN Take 1 tablet (500 mg total) by mouth 4 (four) times daily.   phenytoin 100 MG ER capsule Commonly known as: DILANTIN TAKE 3 CAPSULES IN THE MORNING AND 2 CAPSULES EVERY EVENING   phenytoin 50 MG tablet Commonly known as: DILANTIN   pregabalin 75 MG capsule Commonly known as: LYRICA Take by mouth.   sildenafil 100 MG  tablet Commonly known as: Viagra Take 1 tablet (100 mg total) by mouth daily as needed for erectile dysfunction.   simvastatin 20 MG tablet Commonly known as: ZOCOR TAKE 1 TABLET EVERY EVENING FOR HIGH CHOLESTEROL   Vitamin D-3 125 MCG (5000 UT) Tabs Take 5,000 Units by mouth daily.        Allergies:  Allergies  Allergen Reactions   Oxycodone Other (See Comments)    Other reaction(s): Confusion "Had me all messed up" "Had me all messed up"   Gabapentin Other (See Comments)   Lisinopril    Sulfa Antibiotics Rash    Family History: Family History  Problem Relation Age of Onset   Bladder Cancer Neg Hx    Prostate cancer Neg Hx    Kidney cancer Neg Hx     Social History:  reports that he quit smoking about 14 years ago. His smoking use included cigarettes. He has a 28.50 pack-year smoking history. He has never used smokeless tobacco. He reports current drug use. Drug: Marijuana. He reports that he does not drink alcohol.  ROS: For pertinent review of systems please refer to history of present illness  Physical Exam: There were no vitals taken for this visit.  Constitutional:  Well nourished. Alert and oriented, No acute distress. HEENT: [place] AT, moist mucus membranes.  Trachea midline Cardiovascular: No clubbing, cyanosis, or edema. Respiratory: Normal respiratory effort, no increased work of breathing. GU: No CVA tenderness.  No bladder fullness or masses.  Patient with circumcised/uncircumcised phallus. ***Foreskin easily retracted***  Urethral meatus is patent.  No penile discharge. No penile lesions or rashes. Scrotum without lesions, cysts, rashes and/or edema.  Testicles are located scrotally bilaterally. No masses are appreciated in the testicles. Left and right epididymis are normal. Rectal: Patient with  normal sphincter tone. Anus and perineum without scarring or rashes. No rectal masses are appreciated. Prostate is approximately *** grams, *** nodules are  appreciated. Seminal vesicles are normal. Neurologic: Grossly intact, no focal deficits, moving all 4 extremities. Psychiatric: Normal mood and affect.   Laboratory Data:  Prostate Specific Ag, Serum  Latest Ref Rng 0.0 - 4.0 ng/mL  07/09/2018 1.6   09/14/2019 1.4   10/13/2020 1.7   01/09/2022 1.9   08/10/2022 1.4   I have reviewed the labs.  Pertinent imaging ***  Assessment & Plan:    1.  BPH with LUTS -PSA stable -DRE benign -most bothersome symptoms are frequency and postvoid dribbling -continue conservative management, avoiding bladder irritants and timed voiding's -He does not want to start a medication for this or undergo any further studies at this time -Advised to push up on his perineum after voiding to aid in emptying to decrease postvoid dribbling   2. Family history of prostate cancer -  prostate biopsy negative 2019 -PSA stable -Nodule not palpated on today's exam  3. Prostate nodule -Not appreciated on today's exam  4. ED -Continue sildenafil 100 mg, on-demand dosing  No follow-ups on file.  Zara Council, PA-C  [facility] [address] [place], [place] [address] [phone]

## 2022-08-15 ENCOUNTER — Encounter: Payer: Self-pay | Admitting: Urology

## 2022-08-15 ENCOUNTER — Ambulatory Visit (INDEPENDENT_AMBULATORY_CARE_PROVIDER_SITE_OTHER): Payer: Medicare Other | Admitting: Urology

## 2022-08-15 VITALS — BP 116/71 | HR 64 | Temp 98.0°F | Ht 70.0 in | Wt 136.0 lb

## 2022-08-15 DIAGNOSIS — N401 Enlarged prostate with lower urinary tract symptoms: Secondary | ICD-10-CM | POA: Diagnosis not present

## 2022-08-15 DIAGNOSIS — N138 Other obstructive and reflux uropathy: Secondary | ICD-10-CM

## 2022-08-15 DIAGNOSIS — Z8042 Family history of malignant neoplasm of prostate: Secondary | ICD-10-CM

## 2022-08-15 DIAGNOSIS — N529 Male erectile dysfunction, unspecified: Secondary | ICD-10-CM

## 2022-08-15 DIAGNOSIS — N402 Nodular prostate without lower urinary tract symptoms: Secondary | ICD-10-CM | POA: Diagnosis not present

## 2022-08-15 LAB — BLADDER SCAN AMB NON-IMAGING: Scan Result: 30

## 2022-08-15 MED ORDER — TAMSULOSIN HCL 0.4 MG PO CAPS: 0.4000 mg | ORAL_CAPSULE | Freq: Every day | ORAL | 3 refills | Status: DC

## 2022-08-15 NOTE — Patient Instructions (Signed)
Follow up here in one month.

## 2022-08-26 IMAGING — CT CT HEAD W/O CM
3 series · 15 of 47 positions shown, 18 images · non-contrast
Comparison: October 12, 2004

CLINICAL DATA: Dizziness with vomiting

EXAM:
CT HEAD WITHOUT CONTRAST
TECHNIQUE: Contiguous axial images were obtained from the base of the skull
through the vertex without intravenous contrast.

[Series 2: head wo · axial · 0.44mm/px · z∈[-123,+2]mm · 9 of 31 slices shown, 12 images]
[im 3/31  brain]
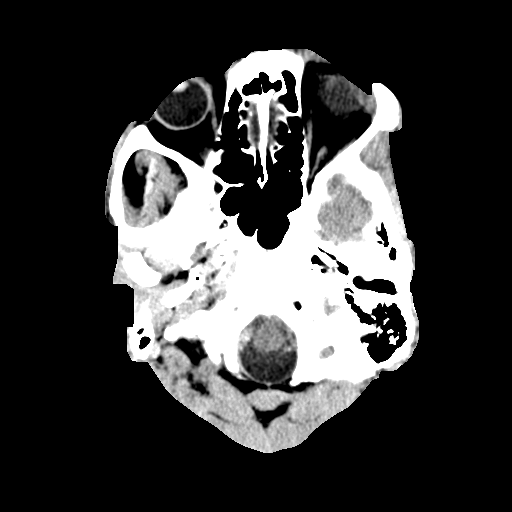
[im 3/31  bone]
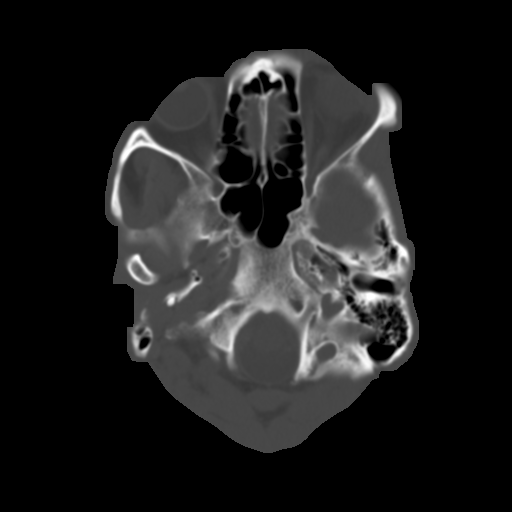
[im 6/31  brain]
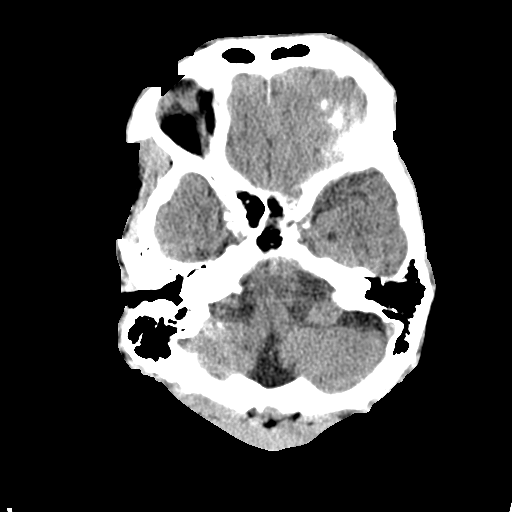
[im 9/31  brain]
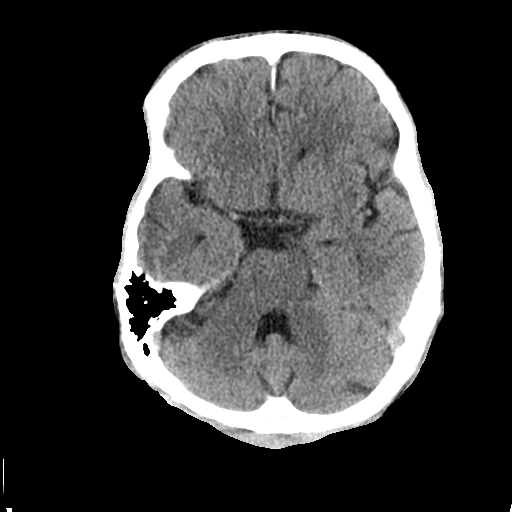
[im 12/31  brain]
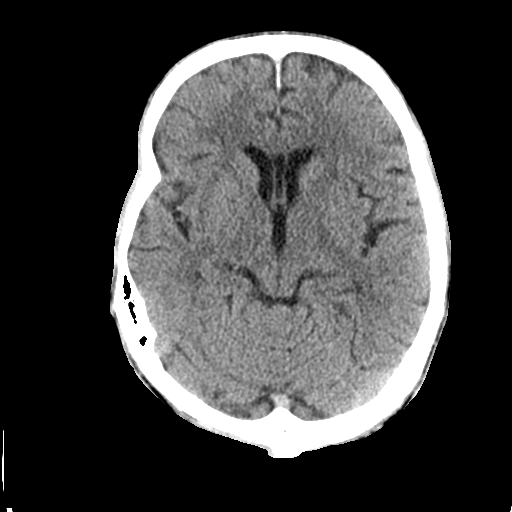
[im 16/31  brain]
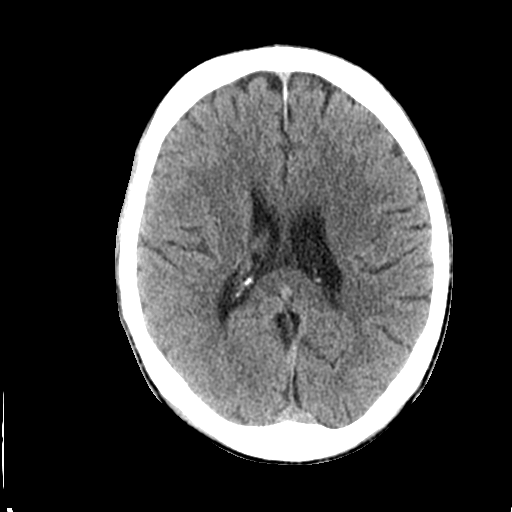
[im 16/31  bone]
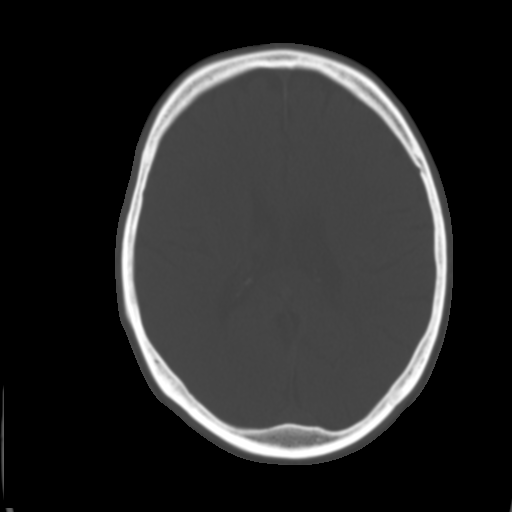
[im 19/31  brain]
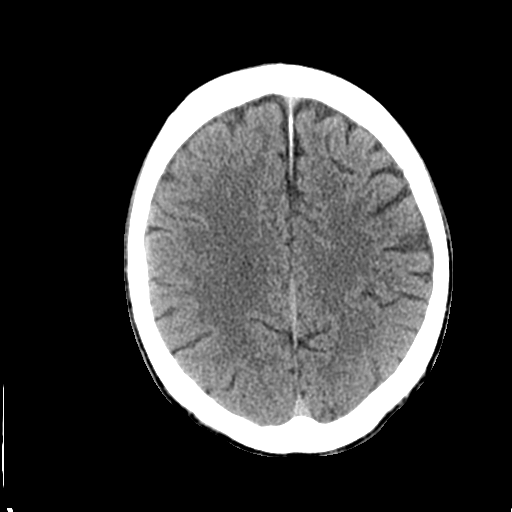
[im 22/31  brain]
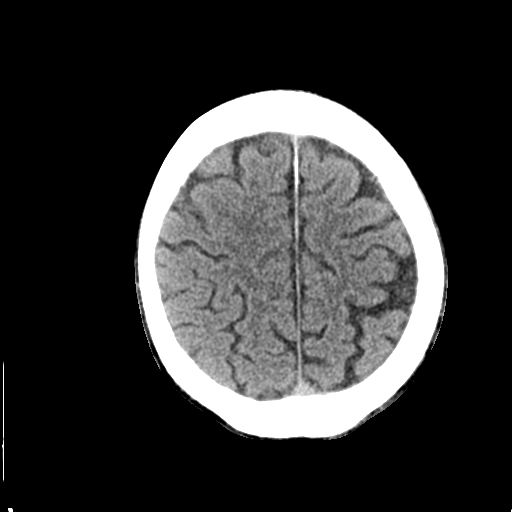
[im 25/31  brain]
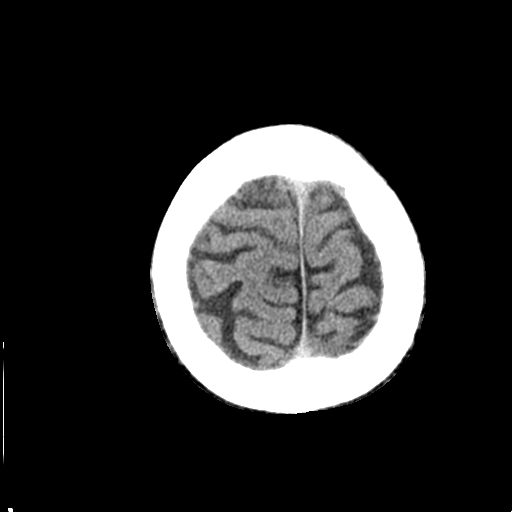
[im 28/31  brain]
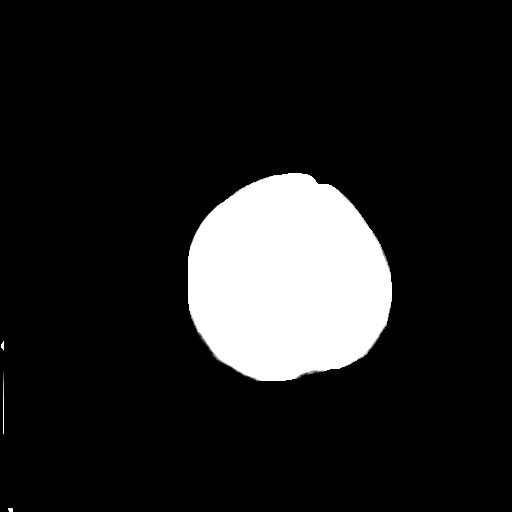
[im 28/31  bone]
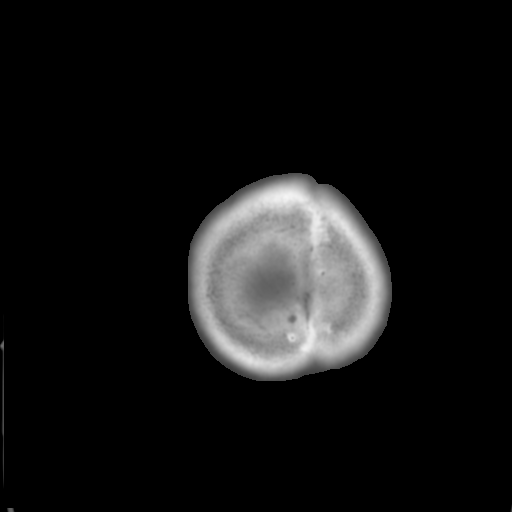

[Series 4: coronal soft tissue · coronal · 0.33mm/px · 3 of 65 slices shown]
[im 22/65  brain]
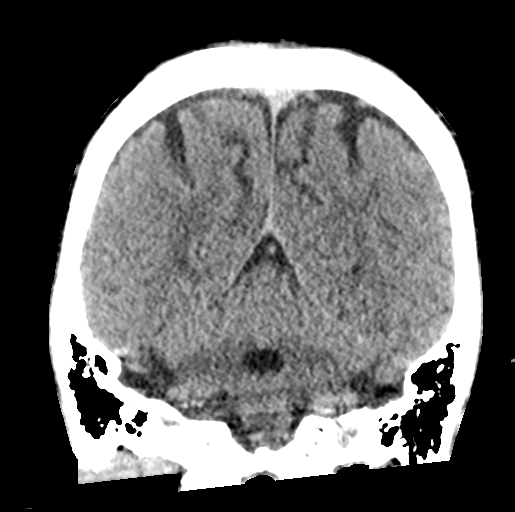
[im 29/65  brain]
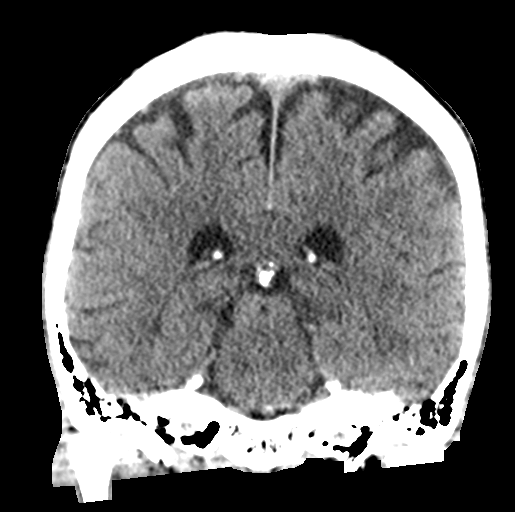
[im 36/65  brain]
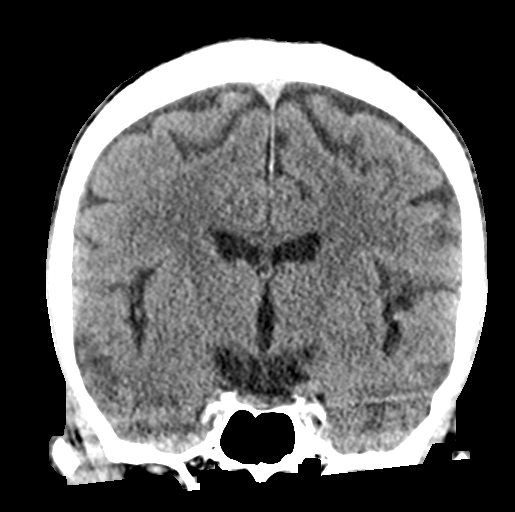

[Series 5: sagittal soft tissue · sagittal · 0.33mm/px · 3 of 53 slices shown]
[im 18/53  brain]
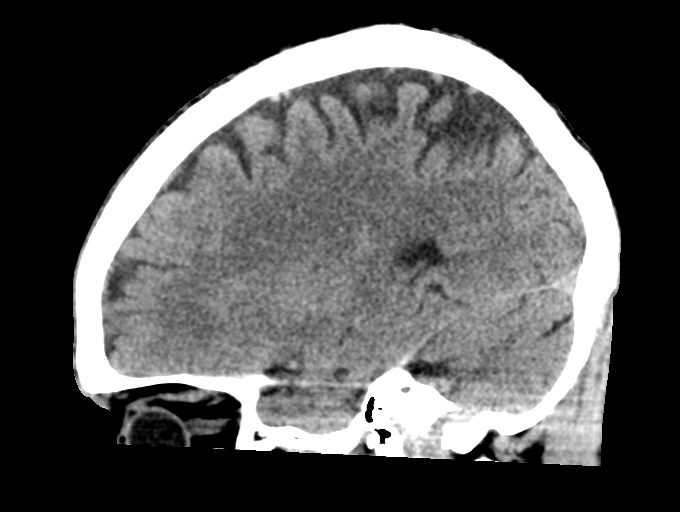
[im 27/53  brain]
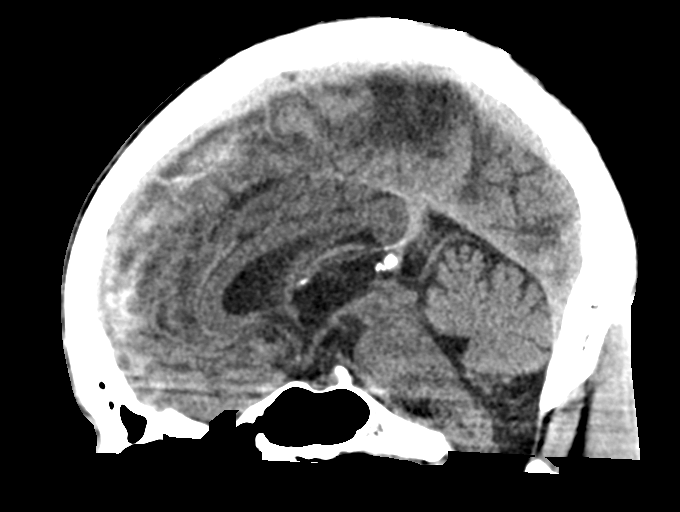
[im 35/53  brain]
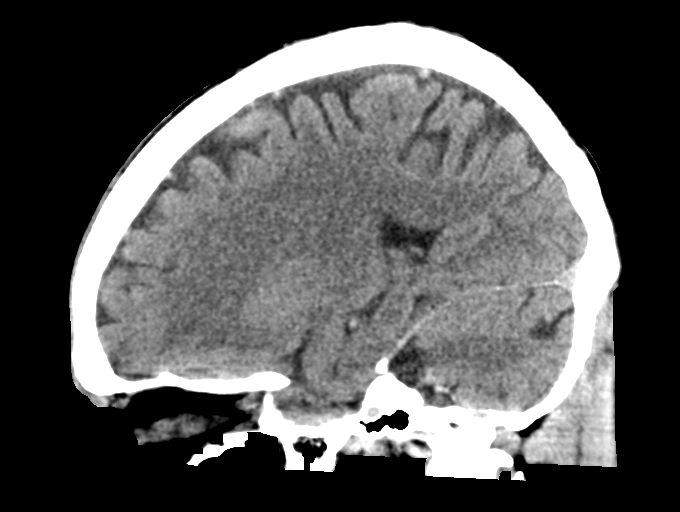

[15 of 47 positions shown; findings below may reference images not displayed]

FINDINGS: Brain: Ventricles and sulci are normal in size and configuration.
There is a cavum septum pellucidum, an anatomic variant. Prominence
of the cisterna magna is a stable anatomic variant. There is no
intracranial mass, hemorrhage, extra-axial fluid collection, or
midline shift. Brain parenchyma appears unremarkable. No
demonstrable acute infarct.

Vascular: No hyperdense vessel. Calcification is noted in each
carotid siphon region.

Skull: The bony calvarium appears intact.

Sinuses/Orbits: Visualized paranasal sinuses are clear. Visualized
orbits appear symmetric bilaterally.

Other: Visualized mastoid air cells are clear. There is debris in
the right external auditory canal.
IMPRESSION: Normal appearing brain parenchyma.  No mass or hemorrhage.

There are foci of arterial vascular calcification. There is probable
cerumen in the right external auditory canal.

## 2022-08-26 IMAGING — CR DG CHEST 2V
1 series · 3 of 3 positions shown · non-contrast
Comparison: September 24, 2014 chest radiograph; chest CT December 07, 2020

CLINICAL DATA: Syncope

EXAM:
CHEST - 2 VIEW

[Series 1: dg chest 2 view · 0.14mm/px · 3 of 3 slices shown]
[im 1/3]
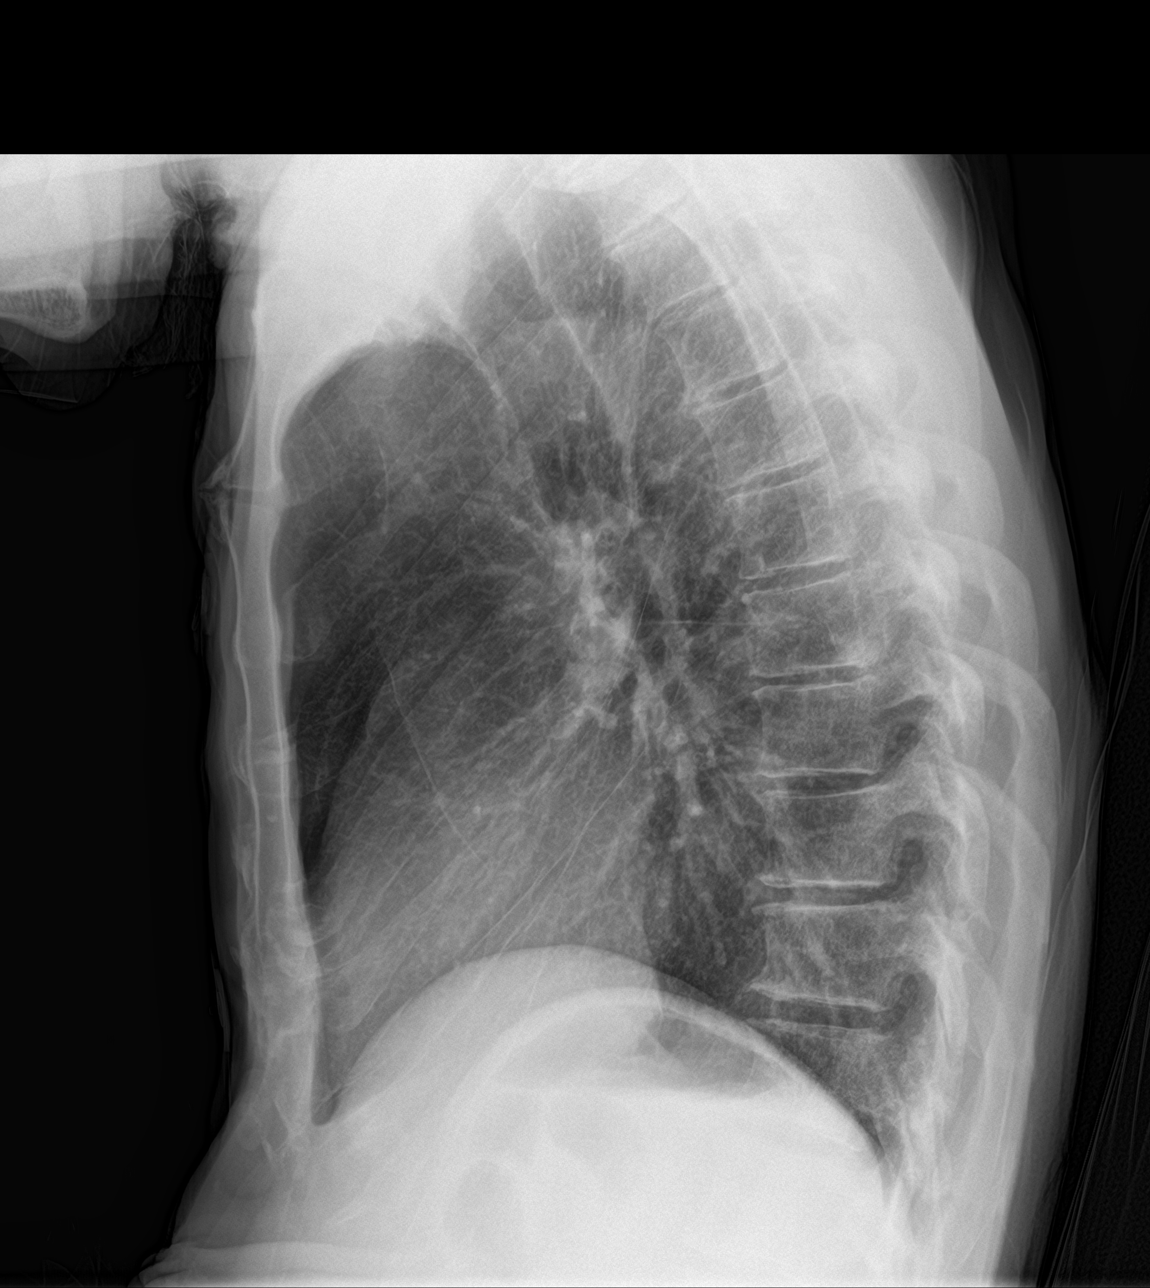
[im 2/3]
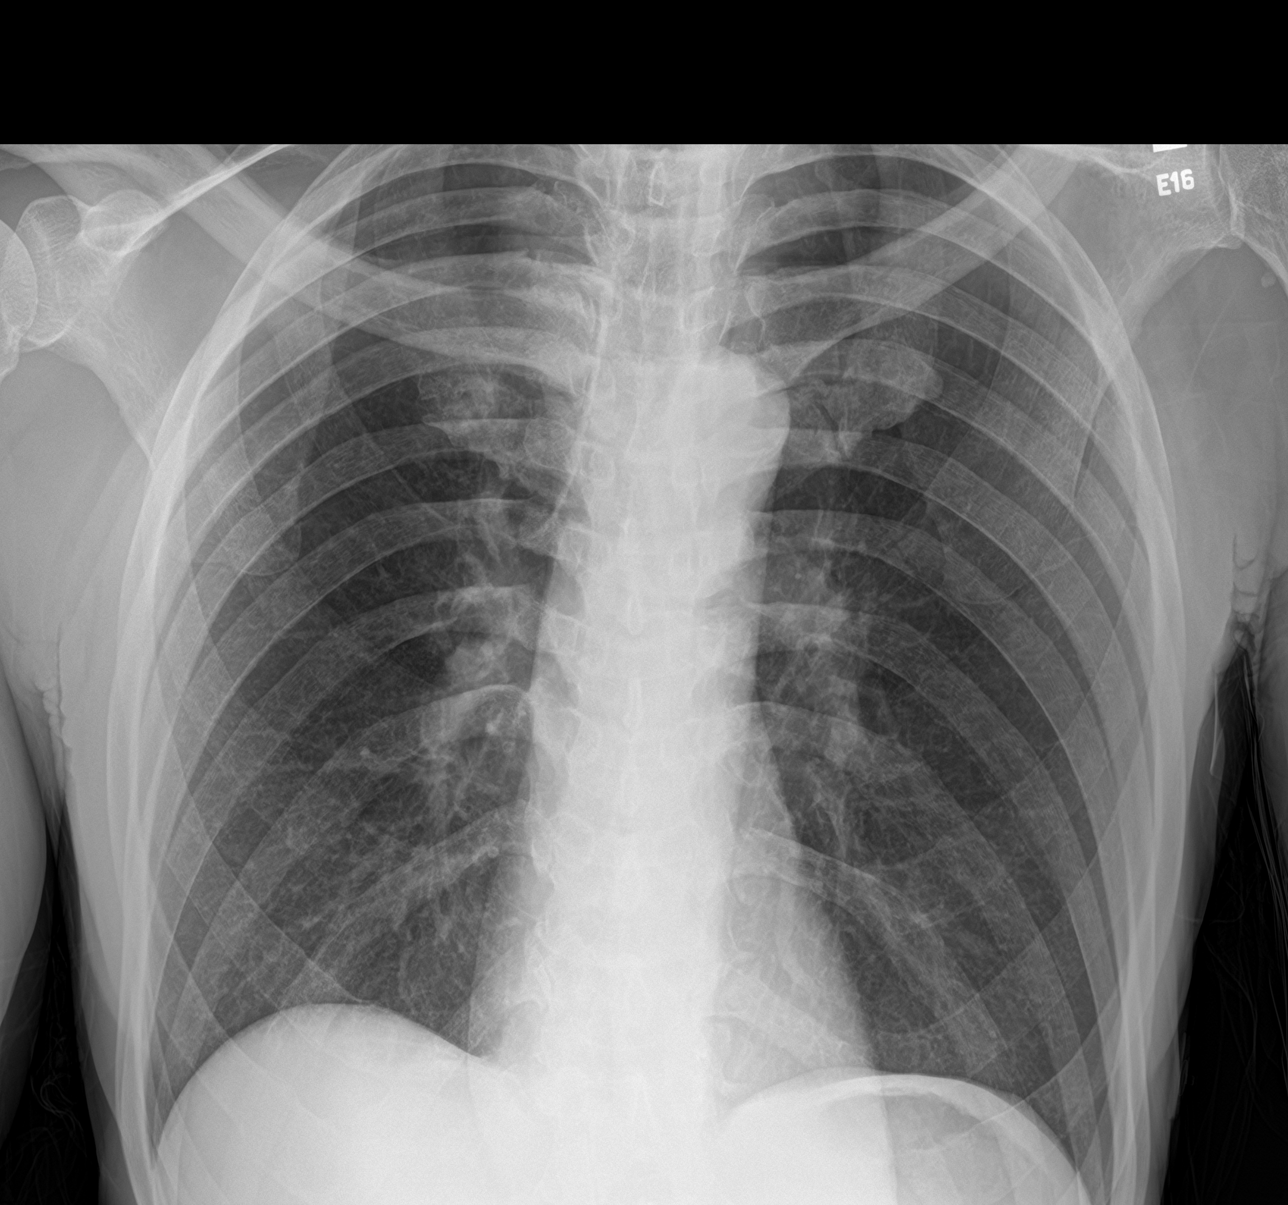
[im 3/3]
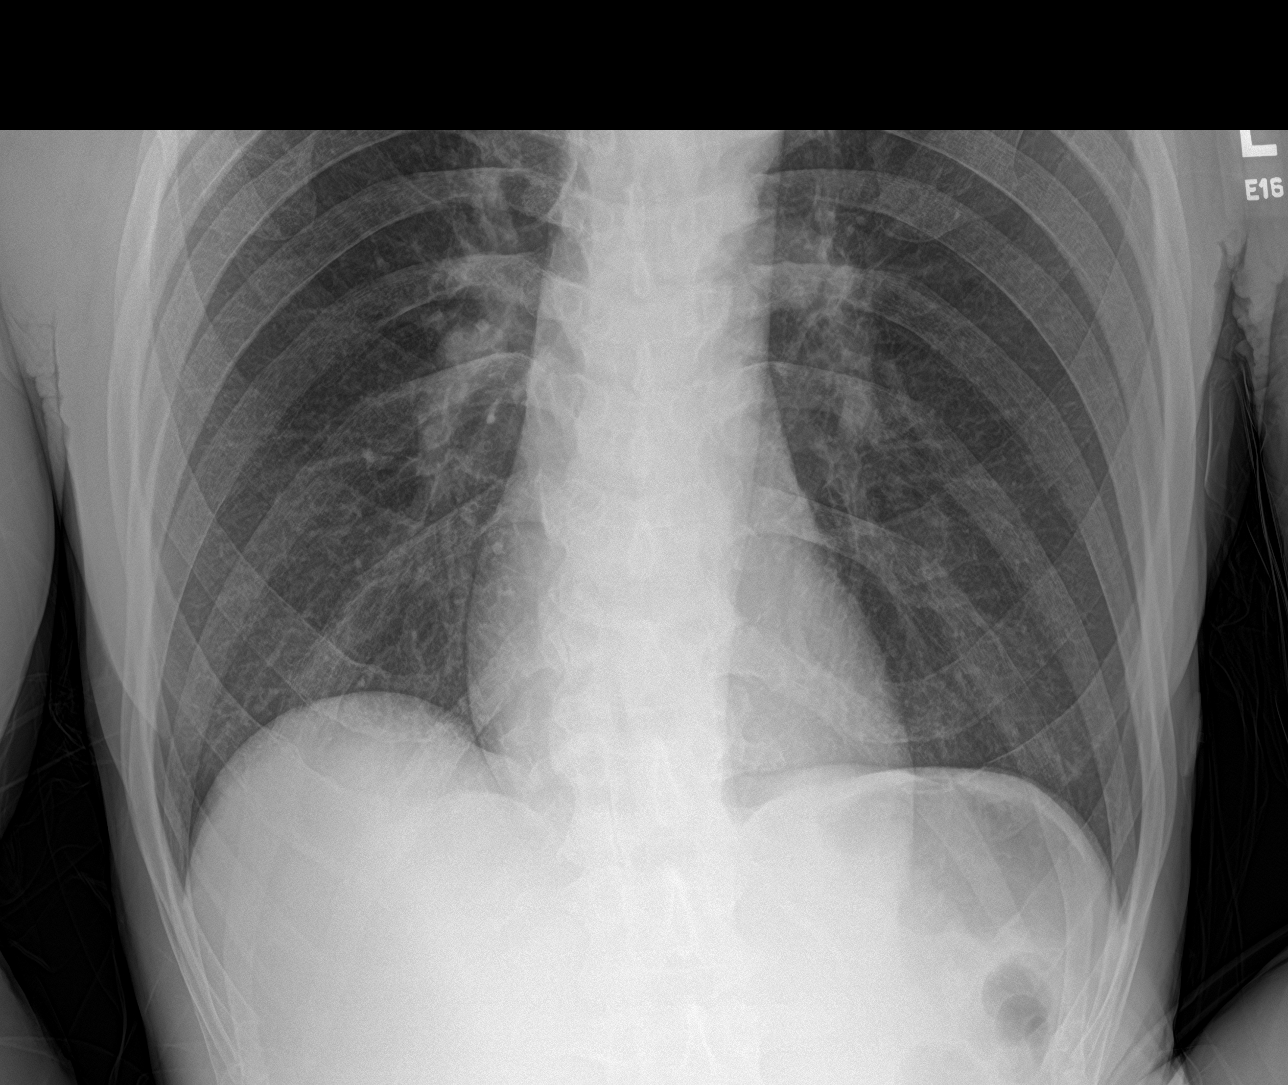

[3 of 3 positions shown; findings below may reference images not displayed]

FINDINGS: There is no edema or airspace opacity. The heart size and pulmonary
vascularity are normal. No adenopathy. No bone lesions.
IMPRESSION: No edema or airspace opacity. Cardiac silhouette within normal
limits.

## 2022-09-17 NOTE — Progress Notes (Unsigned)
09/18/2022 8:18 AM   Kenneth Daniels 06/25/1956 [id number]  Referring provider: Theotis Burrow, MD [address] 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 Prostate nodule -PSA, 2022/08/26 - 1.4 -prostate biopsy 08/2018 benign  3. BPH with LU TS -I PSS 24/3 -PVR 30 mL   4. Prostate cancer screening -PSA (August 26, 2022) 1.4  -cousin died of prostate cancer, age 35 -African ancestry -prostate biopsy (2019) benign  5. ED -contributing factors of age, history of smoking, MI, stroke, CAD, PAD, BPH, HTN, HLD, depression and anxiety -SHIM 17 -sildenafil 100 mg, on-demand-dosing  No chief complaint on file.    HPI: Kenneth Daniels is a 66 y.o. male who presents today for 1 month follow up after a trial of tamsulosin 0.4 mg daily.    Patient denies any modifying or aggravating factors.  Patient denies any gross hematuria, dysuria or suprapubic/flank pain.  Patient denies any fevers, chills, nausea or vomiting.          Score:  1-7 Mild 8-19 Moderate 20-35 Severe   PMH: Past Medical History:  Diagnosis Date   Absolute anemia 03/27/2012   Anxiety    Atypical chest pain 04/17/2016   CAD in native artery 04/17/2016   Cerebral infarction ([place]) 11/25/2013   Overview:  IMO Problem List Replacer Jan. 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   BACK SURGERY     CERVICAL DISC ARTHROPLASTY N/A 09/11/2017   Procedure: CERVICAL ANTERIOR DISC ARTHROPLASTY C3-4 POSSIBLE C4-5;  Surgeon: Meade Maw, MD;  Location: [facility] ORS;  Service: Neurosurgery;  Laterality: N/A;   COLON SURGERY     Colon Resection   HERNIA REPAIR     HOLMIUM LASER APPLICATION N/A 44/96/7591   Procedure: HOLMIUM LASER APPLICATION;  Surgeon: Hollice Espy, MD;  Location: [facility] ORS;  Service: Urology;  Laterality: N/A;   PROSTATE ABLATION N/A 11/20/2016   Procedure: PROSTATE ABLATION;  Surgeon: Hollice Espy, MD;  Location: [facility] ORS;  Service: Urology;  Laterality: N/A;   SOFT TISSUE TUMOR RESECTION     left thigh   TRANSURETHRAL RESECTION OF PROSTATE N/A 11/20/2016   Procedure: TRANSURETHRAL RESECTION OF THE PROSTATE (TURP);  Surgeon: Hollice Espy, MD;  Location: [facility] ORS;  Service: Urology;  Laterality: N/A;    Home Medications:  Allergies as of 09/18/2022       Reactions   Oxycodone Other (See Comments)   Other reaction(s): Confusion "Had me all messed up" "Had me all messed up"   Gabapentin Other (See Comments)   Lisinopril    Sulfa Antibiotics Rash        Medication List        Accurate as of September 17, 2022  8:18 AM. If you have any  questions, ask your nurse or doctor.          aspirin EC 81 MG tablet Take 1 tablet (81 mg total) by mouth daily.   celecoxib 100 MG capsule Commonly known as: CELEBREX Take 100 mg by mouth 2 (two) times daily.   citalopram 20 MG tablet Commonly known as: CELEXA   meloxicam 7.5 MG tablet Commonly known as: Mobic Take 1 tablet (7.5 mg total) by mouth daily.   methocarbamol 500 MG tablet Commonly known as: ROBAXIN Take 1 tablet (500 mg total) by mouth 4 (four) times daily.   phenytoin 100 MG ER capsule Commonly known as: DILANTIN TAKE 3 CAPSULES IN THE MORNING  AND 2 CAPSULES EVERY EVENING   phenytoin 50 MG tablet Commonly known as: DILANTIN   pregabalin 75 MG capsule Commonly known as: LYRICA Take by mouth.   sildenafil 100 MG tablet Commonly known as: Viagra Take 1 tablet (100 mg total) by mouth daily as needed for erectile dysfunction.   simvastatin 20 MG tablet Commonly known as: ZOCOR TAKE 1 TABLET EVERY EVENING FOR HIGH CHOLESTEROL   tamsulosin 0.4 MG Caps capsule Commonly known as: FLOMAX Take 1 capsule (0.4 mg total) by mouth daily.   Vitamin D-3 125 MCG (5000 UT) Tabs Take 5,000 Units by mouth daily.        Allergies:  Allergies  Allergen Reactions   Oxycodone Other (See Comments)    Other reaction(s): Confusion "Had me all messed up" "Had me all messed up"   Gabapentin Other (See Comments)   Lisinopril    Sulfa Antibiotics Rash    Family History: Family History  Problem Relation Age of Onset   Bladder Cancer Neg Hx    Prostate cancer Neg Hx    Kidney cancer Neg Hx     Social History:  reports that he quit smoking about 14 years ago. His smoking use included cigarettes. He has a 28.50 pack-year smoking history. He has never used smokeless tobacco. He reports current drug use. Drug: Marijuana. He reports that he does not drink alcohol.  ROS: For pertinent review of systems please refer to history of present illness  Physical Exam: There were no vitals taken for this visit.  Constitutional:  Well nourished. Alert and oriented, No acute distress. HEENT: [place] AT, moist mucus membranes.  Trachea midline Cardiovascular: No clubbing, cyanosis, or edema. Respiratory: Normal respiratory effort, no increased work of breathing. GU: No CVA tenderness.  No bladder fullness or masses.  Patient with circumcised/uncircumcised phallus. ***Foreskin easily retracted***  Urethral meatus is patent.  No penile discharge. No penile lesions or rashes. Scrotum without lesions, cysts, rashes and/or edema.  Testicles are located  scrotally bilaterally. No masses are appreciated in the testicles. Left and right epididymis are normal. Rectal: Patient with  normal sphincter tone. Anus and perineum without scarring or rashes. No rectal masses are appreciated. Prostate is approximately *** grams, *** nodules are appreciated. Seminal vesicles are normal. Neurologic: Grossly intact, no focal deficits, moving all 4 extremities. Psychiatric: Normal mood and affect.   Laboratory Data: N/A  Pertinent imaging ***   Assessment & Plan:    1.  BPH with LUTS -most bothersome symptoms are frequency and postvoid dribbling -continue conservative management, avoiding bladder irritants and timed voiding's -he would like to try a medication for this at this time -Start tamsulosin 0.4 mg daily  2. Prostate cancer screening  -prostate biopsy negative 2019 -African ancestry -cousin died of prostate cancer, age 2 -PSA stable   No  follow-ups on file.  Jannat Rosemeyer, [place] [address], [place], [place] [address] [phone]

## 2022-09-18 ENCOUNTER — Ambulatory Visit (INDEPENDENT_AMBULATORY_CARE_PROVIDER_SITE_OTHER): Payer: Medicare Other | Admitting: Urology

## 2022-09-18 ENCOUNTER — Encounter: Payer: Self-pay | Admitting: Urology

## 2022-09-18 VITALS — BP 163/86 | HR 48 | Ht 70.0 in | Wt 136.0 lb

## 2022-09-18 DIAGNOSIS — N401 Enlarged prostate with lower urinary tract symptoms: Secondary | ICD-10-CM | POA: Diagnosis not present

## 2022-09-18 DIAGNOSIS — N138 Other obstructive and reflux uropathy: Secondary | ICD-10-CM | POA: Diagnosis not present

## 2022-09-18 DIAGNOSIS — Z125 Encounter for screening for malignant neoplasm of prostate: Secondary | ICD-10-CM

## 2022-09-18 LAB — MICROSCOPIC EXAMINATION
Bacteria, UA: NONE SEEN
RBC, Urine: NONE SEEN /hpf (ref 0–2)

## 2022-09-18 LAB — URINALYSIS, COMPLETE
Bilirubin, UA: NEGATIVE
Glucose, UA: NEGATIVE
Ketones, UA: NEGATIVE
Leukocytes,UA: NEGATIVE
Nitrite, UA: NEGATIVE
Protein,UA: NEGATIVE
RBC, UA: NEGATIVE
Specific Gravity, UA: <1.005 — ABNORMAL LOW (ref 1.005–1.030)
Urobilinogen, Ur: 0.2 mg/dL (ref 0.2–1.0)
pH, UA: 5 (ref 5.0–7.5)

## 2022-09-18 LAB — BLADDER SCAN AMB NON-IMAGING

## 2022-10-02 ENCOUNTER — Other Ambulatory Visit: Payer: Self-pay | Admitting: *Deleted

## 2022-10-02 DIAGNOSIS — N529 Male erectile dysfunction, unspecified: Secondary | ICD-10-CM

## 2022-10-02 MED ORDER — SILDENAFIL CITRATE 100 MG PO TABS: 100.0000 mg | ORAL_TABLET | Freq: Every day | ORAL | 3 refills | Status: DC | PRN

## 2022-12-03 ENCOUNTER — Encounter: Payer: Self-pay | Admitting: *Deleted

## 2022-12-11 ENCOUNTER — Ambulatory Visit
Admission: RE | Admit: 2022-12-11 | Discharge: 2022-12-11 | Disposition: A | Discharge status: Home / Self Care | Payer: Medicare Other | Source: Ambulatory Visit | Attending: Gastroenterology | Admitting: Gastroenterology

## 2022-12-11 ENCOUNTER — Encounter
Admission: RE | Disposition: A | Discharge status: Home / Self Care | Payer: Self-pay | Source: Ambulatory Visit | Attending: Gastroenterology

## 2022-12-11 ENCOUNTER — Ambulatory Visit: Payer: Medicare Other | Admitting: Anesthesiology

## 2022-12-11 ENCOUNTER — Encounter: Payer: Self-pay | Admitting: *Deleted

## 2022-12-11 DIAGNOSIS — Z8673 Personal history of transient ischemic attack (TIA), and cerebral infarction without residual deficits: Secondary | ICD-10-CM | POA: Diagnosis not present

## 2022-12-11 DIAGNOSIS — G40909 Epilepsy, unspecified, not intractable, without status epilepticus: Secondary | ICD-10-CM | POA: Diagnosis not present

## 2022-12-11 DIAGNOSIS — Z1211 Encounter for screening for malignant neoplasm of colon: Secondary | ICD-10-CM | POA: Insufficient documentation

## 2022-12-11 DIAGNOSIS — Z86010 Personal history of colonic polyps: Secondary | ICD-10-CM | POA: Insufficient documentation

## 2022-12-11 DIAGNOSIS — D123 Benign neoplasm of transverse colon: Secondary | ICD-10-CM | POA: Insufficient documentation

## 2022-12-11 DIAGNOSIS — N400 Enlarged prostate without lower urinary tract symptoms: Secondary | ICD-10-CM | POA: Insufficient documentation

## 2022-12-11 DIAGNOSIS — K640 First degree hemorrhoids: Secondary | ICD-10-CM | POA: Diagnosis not present

## 2022-12-11 HISTORY — PX: COLONOSCOPY WITH PROPOFOL: SHX5780

## 2022-12-11 SURGERY — COLONOSCOPY WITH PROPOFOL
Anesthesia: General

## 2022-12-11 MED ORDER — PROPOFOL 500 MG/50ML IV EMUL
INTRAVENOUS | Status: DC | PRN
  Administered 2022-12-11: 125 ug/kg/min via INTRAVENOUS

## 2022-12-11 MED ORDER — GLYCOPYRROLATE 0.2 MG/ML IJ SOLN
INTRAMUSCULAR | Status: AC
  Filled 2022-12-11: qty 1

## 2022-12-11 MED ORDER — PROPOFOL 10 MG/ML IV BOLUS
INTRAVENOUS | Status: AC
  Filled 2022-12-11: qty 20

## 2022-12-11 MED ORDER — LIDOCAINE HCL (CARDIAC) PF 100 MG/5ML IV SOSY
PREFILLED_SYRINGE | INTRAVENOUS | Status: DC | PRN
  Administered 2022-12-11: 20 mg via INTRAVENOUS

## 2022-12-11 MED ORDER — PROPOFOL 10 MG/ML IV BOLUS
INTRAVENOUS | Status: DC | PRN
  Administered 2022-12-11: 30 mg via INTRAVENOUS
  Administered 2022-12-11: 20 mg via INTRAVENOUS
  Administered 2022-12-11: 50 mg via INTRAVENOUS

## 2022-12-11 MED ORDER — GLYCOPYRROLATE 0.2 MG/ML IJ SOLN
INTRAMUSCULAR | Status: DC | PRN
  Administered 2022-12-11: .1 mg via INTRAVENOUS

## 2022-12-11 MED ORDER — SODIUM CHLORIDE 0.9 % IV SOLN
INTRAVENOUS | Status: DC
  Administered 2022-12-11: 1000 mL via INTRAVENOUS

## 2022-12-11 NOTE — Op Note (Signed)
[facility] Patient Name: Kenneth Daniels Procedure Date: 12/11/2022 8:15 AM MRN: [id number] Account #: [id number] Date of Birth: 1956-05-22 Admit Type: Outpatient Age: 66 Room: [facility] ENDO ROOM 1 Gender: Male Note Status: Finalized Instrument Name: Peds Colonoscope [id number] Procedure:             Colonoscopy Indications:           Surveillance: Personal history of adenomatous polyps                         on last colonoscopy > 5 years ago Providers:             Andrey Farmer MD, MD Referring MD:          Elyse Jarvis Revelo (Referring MD) Medicines:             Monitored Anesthesia Care Complications:         No immediate complications. Estimated blood loss:                         Minimal. Procedure:             Pre-Anesthesia Assessment:                        - Prior to the procedure, a History and Physical was                         performed, and patient medications and allergies were                         reviewed. The patient is competent. The risks and                         benefits of the procedure and the sedation options and                         risks were discussed with the patient. All questions                         were answered and informed consent was obtained.                         Patient identification and proposed procedure were                         verified by the physician, the nurse, the                         anesthesiologist, the anesthetist and the technician                         in the endoscopy suite. Mental Status Examination:                         alert and oriented. Airway Examination: normal                         oropharyngeal airway and neck mobility. Respiratory  Examination: clear to auscultation. CV Examination:                         normal. Prophylactic Antibiotics: The patient does not                         require prophylactic antibiotics. Prior                          Anticoagulants: The patient has taken no anticoagulant                         or antiplatelet agents. ASA Grade Assessment: III - A                         patient with severe systemic disease. After reviewing                         the risks and benefits, the patient was deemed in                         satisfactory condition to undergo the procedure. The                         anesthesia plan was to use monitored anesthesia care                         (MAC). Immediately prior to administration of                         medications, the patient was re-assessed for adequacy                         to receive sedatives. The heart rate, respiratory                         rate, oxygen saturations, blood pressure, adequacy of                         pulmonary ventilation, and response to care were                         monitored throughout the procedure. The physical                         status of the patient was re-assessed after the                         procedure.                        After obtaining informed consent, the colonoscope was                         passed under direct vision. Throughout the procedure,                         the patient's blood pressure, pulse, and oxygen  saturations were monitored continuously. The                         Colonoscope was introduced through the anus and                         advanced to the the cecum, identified by appendiceal                         orifice and ileocecal valve. The colonoscopy was                         performed without difficulty. The patient tolerated                         the procedure well. The quality of the bowel                         preparation was good. The ileocecal valve, appendiceal                         orifice, and rectum were photographed. Findings:      The perianal and digital rectal examinations were normal.      A 3 mm polyp was found in the  transverse colon. The polyp was sessile.       The polyp was removed with a cold snare. Resection and retrieval were       complete. Estimated blood loss was minimal.      Internal hemorrhoids were found during retroflexion. The hemorrhoids       were Grade I (internal hemorrhoids that do not prolapse).      The exam was otherwise without abnormality on direct and retroflexion       views. Impression:            - One 3 mm polyp in the transverse colon, removed with                         a cold snare. Resected and retrieved.                        - Internal hemorrhoids.                        - The examination was otherwise normal on direct and                         retroflexion views. Recommendation:        - Discharge patient to home.                        - Resume previous diet.                        - Continue present medications.                        - Await pathology results.                        - Repeat colonoscopy in 7 years for surveillance.                        -  Return to referring physician as previously                         scheduled. Procedure Code(s):     --- Professional ---                        [phone], Colonoscopy, flexible; with removal of                         tumor(s), polyp(s), or other lesion(s) by snare                         technique Diagnosis Code(s):     --- Professional ---                        Z86.010, Personal history of colonic polyps                        D12.3, Benign neoplasm of transverse colon (hepatic                         flexure or splenic flexure)                        K64.0, First degree hemorrhoids CPT copyright 2022 [employer]. All rights reserved. The codes documented in this report are preliminary and upon coder review may  be revised to meet current compliance requirements. Andrey Farmer MD, MD 12/11/2022 9:13:00 AM Number of Addenda: 0 Note Initiated On: 12/11/2022 8:15 AM Scope  Withdrawal Time: 0 hours 8 minutes 58 seconds  Total Procedure Duration: 0 hours 12 minutes 15 seconds  Estimated Blood Loss:  Estimated blood loss was minimal.      [facility]

## 2022-12-11 NOTE — H&P (Signed)
Outpatient short stay form Pre-procedure 12/11/2022  Lesly Rubenstein, MD  Primary Physician: Theotis Burrow, MD  Reason for visit:  Surveillance  History of present illness:    66 y/o gentleman with history of epilepsy, CVA, and BPH here for surveillance colonoscopy. Last colonoscopy was 10 years ago with cecal polyp. No blood thinners. No family history of GI malignancies. States he had a bowel surgery at some point but not sure what kind.    Current Facility-Administered Medications:    0.9 %  sodium chloride infusion, , Intravenous, Continuous, Arielis Leonhart, Hilton Cork, MD, Last Rate: 20 mL/hr at 12/11/22 0815, 1,000 mL at 12/11/22 0815  Medications Prior to Admission  Medication Sig Dispense Refill Last Dose   Cholecalciferol (VITAMIN D-3) 5000 units TABS Take 5,000 Units by mouth daily.   12/11/2022 at 0500   phenytoin (DILANTIN) 100 MG ER capsule TAKE 3 CAPSULES IN THE MORNING AND 2 CAPSULES EVERY EVENING  10 12/11/2022 at 0500   phenytoin (DILANTIN) 50 MG tablet    12/11/2022 at 0500   pregabalin (LYRICA) 75 MG capsule Take by mouth.   12/11/2022 at 0500   simvastatin (ZOCOR) 20 MG tablet TAKE 1 TABLET EVERY EVENING FOR HIGH CHOLESTEROL  3 12/10/2022   aspirin EC 81 MG tablet Take 1 tablet (81 mg total) by mouth daily. (Patient not taking: Reported on 12/11/2022) 90 tablet 3 Not Taking   celecoxib (CELEBREX) 100 MG capsule Take 100 mg by mouth 2 (two) times daily.    at prn   citalopram (CELEXA) 20 MG tablet     at prn   meloxicam (MOBIC) 7.5 MG tablet Take 1 tablet (7.5 mg total) by mouth daily. (Patient not taking: Reported on 12/11/2022) 30 tablet 0 Not Taking   methocarbamol (ROBAXIN) 500 MG tablet Take 1 tablet (500 mg total) by mouth 4 (four) times daily. 24 tablet 0  at prn   sildenafil (VIAGRA) 100 MG tablet Take 1 tablet (100 mg total) by mouth daily as needed for erectile dysfunction. 30 tablet 3  at prn   tamsulosin (FLOMAX) 0.4 MG CAPS capsule Take 1 capsule  (0.4 mg total) by mouth daily. (Patient not taking: Reported on 12/11/2022) 90 capsule 3 Not Taking     Allergies  Allergen Reactions   Oxycodone Other (See Comments)    Other reaction(s): Confusion "Had me all messed up" "Had me all messed up"   Gabapentin Other (See Comments)   Lisinopril    Sulfa Antibiotics Rash     Past Medical History:  Diagnosis Date   Absolute anemia 03/27/2012   Anxiety    Atypical chest pain 04/17/2016   CAD in native artery 04/17/2016   Cerebral infarction ([place]) 11/25/2013   Overview:  IMO Problem List Replacer Jan. 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    Physical Exam  Gen: Alert, oriented. Appears stated age.  HEENT: PERRLA. Lungs: No respiratory distress CV: RRR Abd: soft, benign, no masses Ext:  No edema    Planned procedures: Proceed with colonoscopy. The patient understands the nature of the planned procedure, indications, risks, alternatives and potential complications including but not limited to bleeding, infection, perforation, damage to internal organs and possible oversedation/side effects from anesthesia. The patient agrees and gives consent to proceed.  Please refer to procedure notes for findings, recommendations and patient disposition/instructions.     Lesly Rubenstein, MD [facility]

## 2022-12-11 NOTE — Anesthesia Preprocedure Evaluation (Addendum)
Anesthesia Evaluation  Patient identified by MRN, date of birth, ID band Patient awake    Reviewed: Allergy & Precautions, NPO status , Patient's Chart, lab work & pertinent test results  History of Anesthesia Complications Negative for: history of anesthetic complications  Airway Mallampati: III  TM Distance: >3 FB Neck ROM: full    Dental  (+) Poor Dentition, Partial Upper   Pulmonary former smoker   Pulmonary exam normal        Cardiovascular hypertension, On Medications + CAD, + Past MI and + Peripheral Vascular Disease  Normal cardiovascular exam     Neuro/Psych  Headaches, Seizures -, Well Controlled,  PSYCHIATRIC DISORDERS Anxiety Depression     Neuromuscular disease CVA    GI/Hepatic negative GI ROS,,,(+) Hepatitis -, C  Endo/Other  negative endocrine ROS    Renal/GU negative Renal ROS  negative genitourinary   Musculoskeletal   Abdominal   Peds  Hematology negative hematology ROS (+)   Anesthesia Other Findings Past Medical History: 03/27/2012: Absolute anemia No date: Anxiety 04/17/2016: Atypical chest pain 04/17/2016: CAD in native artery 11/25/2013: Cerebral infarction [facility])     Comment:  Overview:  IMO Problem List Replacer Jan. 2016  07/06/2015: Cervical spinal stenosis 04/15/2008: Essential (primary) hypertension 02/20/2008: Generalized convulsive epilepsy ([place]) 11/25/2013: Headache, migraine No date: Heart murmur No date: Hepatitis     Comment:  Hep "C" 03/27/2012: HLD (hyperlipidemia) No date: Hyperlipidemia 02/26/2011: Idiopathic localized osteoarthropathy 11/14/2009: Major depressive disorder with single episode 2009: Myocardial infarction ([place]) 09/29/2015: Neuropathic ulnar nerve No date: Seizures ([place])     Comment:  Last Seizure 2013 No date: Stroke [facility])  Past Surgical History: 2010?: ABDOMINAL EXPLORATION SURGERY No date: BACK SURGERY 09/11/2017: CERVICAL DISC ARTHROPLASTY; N/A      Comment:  Procedure: CERVICAL ANTERIOR DISC ARTHROPLASTY C3-4               POSSIBLE C4-5;  Surgeon: Meade Maw, MD;                Location: [facility] ORS;  Service: Neurosurgery;  Laterality:               N/A; No date: COLON SURGERY     Comment:  Colon Resection No date: HERNIA REPAIR 11/20/2016: HOLMIUM LASER APPLICATION; N/A     Comment:  Procedure: HOLMIUM LASER APPLICATION;  Surgeon: Hollice Espy, MD;  Location: [facility] ORS;  Service: Urology;                Laterality: N/A; 11/20/2016: PROSTATE ABLATION; N/A     Comment:  Procedure: PROSTATE ABLATION;  Surgeon: Hollice Espy,               MD;  Location: [facility] ORS;  Service: Urology;  Laterality:               N/A; No date: SOFT TISSUE TUMOR RESECTION     Comment:  left thigh 11/20/2016: TRANSURETHRAL RESECTION OF PROSTATE; N/A     Comment:  Procedure: TRANSURETHRAL RESECTION OF THE PROSTATE               (TURP);  Surgeon: Hollice Espy, MD;  Location: [facility]               ORS;  Service: Urology;  Laterality: N/A;     Reproductive/Obstetrics negative OB ROS  Anesthesia Physical Anesthesia Plan  ASA: 3  Anesthesia Plan: General   Post-op Pain Management: Minimal or no pain anticipated   Induction: Intravenous  PONV Risk Score and Plan: Propofol infusion and TIVA  Airway Management Planned: Natural Airway and Nasal Cannula  Additional Equipment:   Intra-op Plan:   Post-operative Plan:   Informed Consent: I have reviewed the patients History and Physical, chart, labs and discussed the procedure including the risks, benefits and alternatives for the proposed anesthesia with the patient or authorized representative who has indicated his/her understanding and acceptance.     Dental Advisory Given  Plan Discussed with: Anesthesiologist, CRNA and Surgeon  Anesthesia Plan Comments: (Patient consented for risks of anesthesia including but not  limited to:  - adverse reactions to medications - risk of airway placement if required - damage to eyes, teeth, lips or other oral mucosa - nerve damage due to positioning  - sore throat or hoarseness - Damage to heart, brain, nerves, lungs, other parts of body or loss of life  Patient voiced understanding.)        Anesthesia Quick Evaluation

## 2022-12-11 NOTE — Anesthesia Postprocedure Evaluation (Signed)
Anesthesia Post Note  Patient: AZEL GUMINA  Procedure(s) Performed: COLONOSCOPY WITH PROPOFOL  Patient location during evaluation: Endoscopy Anesthesia Type: General Level of consciousness: awake and alert Pain management: pain level controlled Vital Signs Assessment: post-procedure vital signs reviewed and stable Respiratory status: spontaneous breathing, nonlabored ventilation, respiratory function stable and patient connected to nasal cannula oxygen Cardiovascular status: blood pressure returned to baseline and stable Postop Assessment: no apparent nausea or vomiting Anesthetic complications: no   No notable events documented.   Last Vitals:  Vitals:   12/11/22 0923 12/11/22 0933  BP: (!) 168/70 (!) 165/63  Pulse: (!) 51 (!) 57  Resp: (!) 22 20  Temp:    SpO2: 100% 100%    Last Pain:  Vitals:   12/11/22 0923  TempSrc:   PainSc: Asleep                 Ilene Qua

## 2022-12-11 NOTE — Interval H&P Note (Signed)
History and Physical Interval Note:  12/11/2022 8:29 AM  Kenneth Daniels  has presented today for surgery, with the diagnosis of H/O Adenomatous Polyps.  The various methods of treatment have been discussed with the patient and family. After consideration of risks, benefits and other options for treatment, the patient has consented to  Procedure(s): COLONOSCOPY WITH PROPOFOL (N/A) as a surgical intervention.  The patient's history has been reviewed, patient examined, no change in status, stable for surgery.  I have reviewed the patient's chart and labs.  Questions were answered to the patient's satisfaction.     Lesly Rubenstein  Ok to proceed with colonoscopy

## 2022-12-11 NOTE — Transfer of Care (Signed)
Immediate Anesthesia Transfer of Care Note  Patient: AHMET SCHANK  Procedure(s) Performed: COLONOSCOPY WITH PROPOFOL  Patient Location: PACU  Anesthesia Type:General  Level of Consciousness: drowsy  Airway & Oxygen Therapy: Patient Spontanous Breathing  Post-op Assessment: Report given to RN and Post -op Vital signs reviewed and stable  Post vital signs: Reviewed and stable  Last Vitals:  Vitals Value Taken Time  BP 115/65 12/11/22 0913  Temp    Pulse 52 12/11/22 0913  Resp 17 12/11/22 0913  SpO2 100 % 12/11/22 0913    Last Pain:  Vitals:   12/11/22 0838  TempSrc:   PainSc: 0-No pain         Complications: No notable events documented.

## 2022-12-12 ENCOUNTER — Encounter: Payer: Self-pay | Admitting: Gastroenterology

## 2022-12-12 LAB — SURGICAL PATHOLOGY

## 2022-12-18 ENCOUNTER — Emergency Department: Payer: Medicare Other

## 2022-12-18 ENCOUNTER — Encounter: Payer: Self-pay | Admitting: Emergency Medicine

## 2022-12-18 ENCOUNTER — Emergency Department
Admission: EM | Admit: 2022-12-18 | Discharge: 2022-12-18 | Disposition: A | Discharge status: Home / Self Care | Payer: Medicare Other | Attending: Emergency Medicine | Admitting: Emergency Medicine

## 2022-12-18 ENCOUNTER — Other Ambulatory Visit: Payer: Self-pay

## 2022-12-18 DIAGNOSIS — M62838 Other muscle spasm: Secondary | ICD-10-CM | POA: Insufficient documentation

## 2022-12-18 DIAGNOSIS — R519 Headache, unspecified: Secondary | ICD-10-CM | POA: Diagnosis present

## 2022-12-18 DIAGNOSIS — M542 Cervicalgia: Secondary | ICD-10-CM | POA: Diagnosis not present

## 2022-12-18 LAB — CBC WITH DIFFERENTIAL/PLATELET
Abs Immature Granulocytes: 0.03 10*3/uL (ref 0.00–0.07)
Basophils Absolute: 0 10*3/uL (ref 0.0–0.1)
Basophils Relative: 0 %
Eosinophils Absolute: 0 10*3/uL (ref 0.0–0.5)
Eosinophils Relative: 0 %
HCT: 40.2 % (ref 39.0–52.0)
Hemoglobin: 13.2 g/dL (ref 13.0–17.0)
Immature Granulocytes: 0 %
Lymphocytes Relative: 19 %
Lymphs Abs: 1.9 10*3/uL (ref 0.7–4.0)
MCH: 30 pg (ref 26.0–34.0)
MCHC: 32.8 g/dL (ref 30.0–36.0)
MCV: 91.4 fL (ref 80.0–100.0)
Monocytes Absolute: 1.1 10*3/uL — ABNORMAL HIGH (ref 0.1–1.0)
Monocytes Relative: 11 %
Neutro Abs: 7.2 10*3/uL (ref 1.7–7.7)
Neutrophils Relative %: 70 %
Platelets: 159 10*3/uL (ref 150–400)
RBC: 4.4 MIL/uL (ref 4.22–5.81)
RDW: 13.9 % (ref 11.5–15.5)
WBC: 10.2 10*3/uL (ref 4.0–10.5)
nRBC: 0 % (ref 0.0–0.2)

## 2022-12-18 LAB — BASIC METABOLIC PANEL
Anion gap: 7 (ref 5–15)
BUN: 9 mg/dL (ref 8–23)
CO2: 25 mmol/L (ref 22–32)
Calcium: 9.5 mg/dL (ref 8.9–10.3)
Chloride: 107 mmol/L (ref 98–111)
Creatinine, Ser: 0.8 mg/dL (ref 0.61–1.24)
GFR, Estimated: >60 mL/min (ref >60)
Glucose, Bld: 112 mg/dL — ABNORMAL HIGH (ref 70–99)
Potassium: 4.4 mmol/L (ref 3.5–5.1)
Sodium: 139 mmol/L (ref 135–145)

## 2022-12-18 LAB — TROPONIN I (HIGH SENSITIVITY)
Troponin I (High Sensitivity): 57 ng/L — ABNORMAL HIGH (ref <18)
Troponin I (High Sensitivity): 51 ng/L — ABNORMAL HIGH (ref <18)

## 2022-12-18 MED ORDER — IOHEXOL 350 MG/ML SOLN
75.0000 mL | Freq: Once | INTRAVENOUS | Status: AC | PRN
  Administered 2022-12-18: 75 mL via INTRAVENOUS

## 2022-12-18 MED ORDER — IBUPROFEN 600 MG PO TABS: 600.0000 mg | ORAL_TABLET | Freq: Four times a day (QID) | ORAL | 0 refills | Status: AC | PRN

## 2022-12-18 MED ORDER — LIDOCAINE 5 % EX PTCH
1.0000 | MEDICATED_PATCH | CUTANEOUS | Status: DC
  Administered 2022-12-18: 1 via TRANSDERMAL
  Filled 2022-12-18: qty 1

## 2022-12-18 MED ORDER — LIDOCAINE 5 % EX PTCH: 1.0000 | MEDICATED_PATCH | Freq: Two times a day (BID) | CUTANEOUS | 0 refills | Status: AC

## 2022-12-18 MED ORDER — KETOROLAC TROMETHAMINE 15 MG/ML IJ SOLN
15.0000 mg | Freq: Once | INTRAMUSCULAR | Status: AC
  Administered 2022-12-18: 15 mg via INTRAVENOUS
  Filled 2022-12-18: qty 1

## 2022-12-18 MED ORDER — ACETAMINOPHEN 500 MG PO TABS
1000.0000 mg | ORAL_TABLET | Freq: Once | ORAL | Status: AC
  Administered 2022-12-18: 1000 mg via ORAL
  Filled 2022-12-18: qty 2

## 2022-12-18 MED ORDER — CYCLOBENZAPRINE HCL 5 MG PO TABS: 5.0000 mg | ORAL_TABLET | Freq: Three times a day (TID) | ORAL | 0 refills | Status: AC | PRN

## 2022-12-18 NOTE — ED Provider Triage Note (Signed)
Emergency Medicine Provider Triage Evaluation Note  Kenneth Daniels , a 66 y.o. male  was evaluated in triage.  Pt complains of head and neck pain since Friday after sliding out of bed.  Review of Systems  Positive: Head pain Negative: No numbness/weakness  Physical Exam  BP (!) 169/83 (BP Location: Left Arm)   Pulse 64   Temp 98.3 F (36.8 C) (Oral)   Resp 18   Ht '5\' 10"'$  (1.778 m)   Wt 62.1 kg   SpO2 98%   BMI 19.66 kg/m  Gen:   Awake, no distress   Resp:  Normal effort  MSK:   Moves extremities without difficulty - no sensation changes  Other:  Pain in neck and back of head   Medical Decision Making  Medically screening exam initiated at 12:26 AM.  Appropriate orders placed.  Kenneth Daniels was informed that the remainder of the evaluation will be completed by another provider, this initial triage assessment does not replace that evaluation, and the importance of remaining in the ED until their evaluation is complete.  CT head neck    Vanessa [place], MD 12/18/22 [phone]

## 2022-12-18 NOTE — ED Triage Notes (Signed)
Patient ambulatory to triage with steady gait, without difficulty or distress noted; pt reports since awakening Saturday having generalized HA into shoulders; st pain increases with ROM of neck; denies any injury or accomp symptoms

## 2022-12-18 NOTE — ED Notes (Signed)
Patient discharged to home per MD order. Patient in stable condition, and deemed medically cleared by ED provider for discharge. Discharge instructions reviewed with patient/family using "Teach Back"; verbalized understanding of medication education and administration, and information about follow-up care. Denies further concerns. ° °

## 2022-12-18 NOTE — ED Provider Notes (Signed)
[facility] Provider Note    Event Date/Time   First MD Initiated Contact with Patient 12/18/22 0230     (approximate)   History   Headache   HPI  Kenneth Daniels is a 66 y.o. male with prior neck surgery who reports having pain in his head and neck.  Patient reports that he just woke up Saturday with a generalized headache going down into his neck.  He states that he had difficulty getting out of bed but he did not fall out of bed he did not injure it.  He denies any fevers.  He reports a spasming pain and pain when he moves his neck.  He denies this ever happening previously but does report a history of arthritis.  He reports taking some kind of pain medication that was prescribed to him previously but he is not sure exactly what it was.  He denies any numbness, tingling.  Denies any urinary or bladder incontinence.  Denies any stool incontinence.  Denies any rectal numbness. Patient denies any fevers, history of recent herpes, high risk sexual activity.  He does report an episode of chest discomfort yesterday but he states that was coming from his neck and going into his right chest.  He reports that he now has resolution and denies any current chest pain.  Denies any prior stents put in.  I reviewed a note where patient was seen by Dr. Cari Caraway for neck pain previously.  He reported sharp shooting pain in his neck and then the description of this pain seems similar to the pain that he is having tonight.   Physical Exam   Triage Vital Signs: ED Triage Vitals  Enc Vitals Group     BP 12/18/22 0025 (!) 169/83     Pulse Rate 12/18/22 0025 64     Resp 12/18/22 0025 18     Temp 12/18/22 0025 98.3 F (36.8 C)     Temp Source 12/18/22 0025 Oral     SpO2 12/18/22 0025 98 %     Weight 12/18/22 0024 137 lb (62.1 kg)     Height 12/18/22 0024 '5\' 10"'$  (1.778 m)     Head Circumference --      Peak Flow --      Pain Score 12/18/22 0024 10     Pain Loc --       Pain Edu? --      Excl. in [place]? --     Most recent vital signs: Vitals:   12/18/22 0025  BP: (!) 169/83  Pulse: 64  Resp: 18  Temp: 98.3 F (36.8 C)  SpO2: 98%     General: Awake, no distress.  CV:  Good peripheral perfusion.  Resp:  Normal effort.  Abd:  No distention.  Other:  Equal strength in arms and legs.  Sensation intact in arms and legs.  He reports pain and occasionally spasms up that his neck hurts with stabbing  pain.   ED Results / Procedures / Treatments   Labs (all labs ordered are listed, but only abnormal results are displayed) Labs Reviewed  CBC WITH DIFFERENTIAL/PLATELET  BASIC METABOLIC PANEL  TROPONIN I (HIGH SENSITIVITY)     EKG  My interpretation of EKG:  Sinus bradycardia rate of 54 without any ST elevation or T wave inversions except aVL, normal intervals  RADIOLOGY I have reviewed the CT head personally interpreted no evidence of intercranial hemorrhage  PROCEDURES:  Critical Care performed: No  Procedures  MEDICATIONS ORDERED IN ED: Medications  lidocaine (LIDODERM) 5 % 1 patch (1 patch Transdermal Patch Applied 12/18/22 0311)  acetaminophen (TYLENOL) tablet 1,000 mg (1,000 mg Oral Given 12/18/22 0311)  iohexol (OMNIPAQUE) 350 MG/ML injection 75 mL (75 mLs Intravenous Contrast Given 12/18/22 0353)  ketorolac (TORADOL) 15 MG/ML injection 15 mg (15 mg Intravenous Given 12/18/22 0504)     IMPRESSION / MDM / ASSESSMENT AND PLAN / ED COURSE  I reviewed the triage vital signs and the nursing notes.   Patient's presentation is most consistent with acute presentation with potential threat to life or bodily function.   CT head and CT cervical ordered from triage without evidence of intracranial hemorrhage or cervical fracture.  Given patient reports this pain I will get a CTA just to ensure no signs of aneurysm, dissection although it seems more musculoskeletal.  Especially after reviewing his note from neurosurgery in the past it  seems that he has had this pain before and there was some talk of getting some facet injections back in June 2020 I do not see that he is followed up recently with him.   BMP is reassuring.  CBC reassuring.  Initial troponin elevated patient denies any current chest pain will get a repeat.  On repeat assessment patient reports feeling much better.  He states that he just wants to be able to go home and lay down and relax.  Based upon my examination and workup I do not feel that this is meningitis.  Not feel that he needs LP.  Suspect most likely musculoskeletal with patient's known cervical myelopathy in the past.  Patient has significant resolution of symptoms with medications.  Will discharge on ibuprofen, lidocaine patches, short course of Flexeril.  Patient understands not to drive or work while on this  I got called to the patient's room patient stating that he needed to leave now.  Repeat troponin downtrending to 51 he continues to deny any active chest pain.  I suspect that this is not related to an active heart attack given no active chest pain it could be some from the pain or the hypertension from the pain.  He reports his pain is now much better and feels comfortable discharge home.  I considered admission given troponins were slightly elevated but patient reports no chest pain and would like to go home and follow-up outpatient.  He understands however that if he develops chest pain he would need to return to the ER for repeat evaluation.  He expressed understanding and stated that he would.  Will give him cardiology referral and we discussed returning if he develops recurrent chest pain.   FINAL CLINICAL IMPRESSION(S) / ED DIAGNOSES   Final diagnoses:  Muscle spasm  Neck pain     Rx / DC Orders   ED Discharge Orders          Ordered    ibuprofen (ADVIL) 600 MG tablet  Every 6 hours PRN        12/18/22 0546    lidocaine (LIDODERM) 5 %  Every 12 hours        12/18/22 0546     cyclobenzaprine (FLEXERIL) 5 MG tablet  3 times daily PRN        12/18/22 0546    Ambulatory referral to Cardiology        12/18/22 0547             Note:  This document was prepared using Dragon voice recognition software and may include  unintentional dictation errors.   Vanessa [place], MD 12/18/22 [phone]

## 2022-12-18 NOTE — Discharge Instructions (Addendum)
Take Tylenol 1 g every 8 hours with the ibuprofen and with the pain patches.  You can use the muscle spasm medication as needed but it can cause sedation so do not drive or work while on this.  Return to the ER if you develop fevers, worsening symptoms numbness, tingling weakness in 1 arm or any other concerns   Follow up CT and elevated Blood pressure with PCP.   IMPRESSION: CTA demonstrates calcified atherosclerosis in the head and neck but no significant stenosis, and is negative for aneurysm or other vascular abnormality.

## 2023-01-03 NOTE — Progress Notes (Deleted)
Cardiology Clinic Note   Patient Name: Kenneth Daniels Date of Encounter: 01/03/2023  Primary Care Provider:  Theotis Burrow, MD Primary Cardiologist:  Ida Rogue, MD  Patient Profile    67 year old male with previously past medical history of peripheral arterial disease, carotid stenosis, coronary artery disease with an unclear history myocardial infarction and heart catheterization, essential hypertension, hyperlipidemia hepatitis C, epilepsy, stroke, and current smoker,  who is here today for follow-up.  Past Medical History    Past Medical History:  Diagnosis Date   Absolute anemia 03/27/2012   Anxiety    Atypical chest pain 04/17/2016   CAD in native artery 04/17/2016   Cerebral infarction ([place]) 11/25/2013   Overview:  IMO Problem List Replacer Jan. 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   BACK SURGERY     CERVICAL DISC ARTHROPLASTY N/A 09/11/2017   Procedure: CERVICAL ANTERIOR DISC ARTHROPLASTY C3-4 POSSIBLE C4-5;  Surgeon: Meade Maw, MD;  Location: [facility] ORS;  Service: Neurosurgery;  Laterality: N/A;   COLON SURGERY     Colon Resection   COLONOSCOPY WITH PROPOFOL N/A 12/11/2022   Procedure: COLONOSCOPY WITH PROPOFOL;  Surgeon: Lesly Rubenstein, MD;  Location: [facility] ENDOSCOPY;  Service: Endoscopy;  Laterality: N/A;   HERNIA REPAIR     HOLMIUM LASER APPLICATION N/A [id number]/0076   Procedure: HOLMIUM LASER  APPLICATION;  Surgeon: Hollice Espy, MD;  Location: [facility] ORS;  Service: Urology;  Laterality: N/A;   PROSTATE ABLATION N/A 11/20/2016   Procedure: PROSTATE ABLATION;  Surgeon: Hollice Espy, MD;  Location: [facility] ORS;  Service: Urology;  Laterality: N/A;   SOFT TISSUE TUMOR RESECTION     left thigh   TRANSURETHRAL RESECTION OF PROSTATE N/A 11/20/2016   Procedure: TRANSURETHRAL RESECTION OF THE PROSTATE (TURP);  Surgeon: Hollice Espy, MD;  Location: [facility] ORS;  Service: Urology;  Laterality: N/A;    Allergies  Allergies  Allergen Reactions   Oxycodone Other (See Comments)    Other reaction(s): Confusion "Had me all messed up" "Had me all messed up"   Gabapentin Other (See Comments)   Lisinopril    Sulfa Antibiotics Rash    History of Present Illness    Kenneth Daniels is a 67 year old male with previously mentioned past medical history of tobacco use, carotid artery disease, epilepsy, hepatitis C, coronary artery disease, peripheral arterial disease, essential hypertension, hyperlipidemia, and stroke.  Patient stated MI many years ago at [facility] with records unavailable.  No prior cardiac catheterization per previous notes.  2018 stress test was low risk.  Carotid duplex 04/2019 bilateral 1-39% stenosis recommended for repeat study as needed.  Last seen in clinic with Dr. Rockey Situ 06/21/2021 for yearly follow-up where he stated that he was doing well.  There were no changes made to his medication and no further testing that was needed.  He returns to clinic today  Home Medications    Current Outpatient Medications  Medication  Sig Dispense Refill   aspirin EC 81 MG tablet Take 1 tablet (81 mg total) by mouth daily. (Patient not taking: Reported on 12/11/2022) 90 tablet 3   celecoxib (CELEBREX) 100 MG capsule Take 100 mg by mouth 2 (two) times daily.     Cholecalciferol (VITAMIN D-3) 5000 units TABS Take 5,000 Units by mouth daily.     citalopram (CELEXA) 20 MG tablet      meloxicam  (MOBIC) 7.5 MG tablet Take 1 tablet (7.5 mg total) by mouth daily. (Patient not taking: Reported on 12/11/2022) 30 tablet 0   methocarbamol (ROBAXIN) 500 MG tablet Take 1 tablet (500 mg total) by mouth 4 (four) times daily. 24 tablet 0   phenytoin (DILANTIN) 100 MG ER capsule TAKE 3 CAPSULES IN THE MORNING AND 2 CAPSULES EVERY EVENING  10   phenytoin (DILANTIN) 50 MG tablet      pregabalin (LYRICA) 75 MG capsule Take by mouth.     sildenafil (VIAGRA) 100 MG tablet Take 1 tablet (100 mg total) by mouth daily as needed for erectile dysfunction. 30 tablet 3   simvastatin (ZOCOR) 20 MG tablet TAKE 1 TABLET EVERY EVENING FOR HIGH CHOLESTEROL  3   tamsulosin (FLOMAX) 0.4 MG CAPS capsule Take 1 capsule (0.4 mg total) by mouth daily. (Patient not taking: Reported on 12/11/2022) 90 capsule 3   No current facility-administered medications for this visit.     Family History    Family History  Problem Relation Age of Onset   Bladder Cancer Neg Hx    Prostate cancer Neg Hx    Kidney cancer Neg Hx    He indicated that his mother is deceased. He indicated that his father is deceased. He indicated that the status of his neg hx is unknown.  Social History    Social History   Socioeconomic History   Marital status: Legally Separated    Spouse name: Not on file   Number of children: Not on file   Years of education: Not on file   Highest education level: Not on file  Occupational History   Not on file  Tobacco Use   Smoking status: Former    Packs/day: 0.75    Years: 38.00    Total pack years: 28.50    Types: Cigarettes    Quit date: 2009    Years since quitting: 15.0   Smokeless tobacco: Never  Vaping Use   Vaping Use: Never used  Substance and Sexual Activity   Alcohol use: No    Alcohol/week: 0.0 standard drinks of alcohol   Drug use: Yes    Types: Marijuana    Comment: use every day   Sexual activity: Not on file  Other Topics Concern   Not on file  Social History Narrative    Not on file   Social Determinants of Health   Financial Resource Strain: Not on file  Food Insecurity: Not on file  Transportation Needs: Not on file  Physical Activity: Not on file  Stress: Not on file  Social Connections: Not on file  Intimate Partner Violence: Not on file     Review of Systems    General:  No chills, fever, night sweats or weight changes.  Cardiovascular:  No chest pain, dyspnea on exertion, edema, orthopnea, palpitations, paroxysmal nocturnal dyspnea. Dermatological: No rash, lesions/masses Respiratory: No cough, dyspnea Urologic: No hematuria, dysuria Abdominal:   No nausea, vomiting, diarrhea, bright red blood per rectum, melena, or hematemesis Neurologic:  No visual changes, wkns, changes  in mental status. All other systems reviewed and are otherwise negative except as noted above.     Physical Exam    VS:  There were no vitals taken for this visit. , BMI There is no height or weight on file to calculate BMI.     GEN: Well nourished, well developed, in no acute distress. HEENT: normal. Neck: Supple, no JVD, carotid bruits, or masses. Cardiac: RRR, no murmurs, rubs, or gallops. No clubbing, cyanosis, edema.  Radials 2+/PT 2+ and equal bilaterally.  Respiratory:  Respirations regular and unlabored, clear to auscultation bilaterally. GI: Soft, nontender, nondistended, BS + x 4. MS: no deformity or atrophy. Skin: warm and dry, no rash. Neuro:  Strength and sensation are intact. Psych: Normal affect.  Accessory Clinical Findings    ECG personally reviewed by me today- *** - No acute changes  Lab Results  Component Value Date   WBC 10.2 12/18/2022   HGB 13.2 12/18/2022   HCT 40.2 12/18/2022   MCV 91.4 12/18/2022   PLT 159 12/18/2022   Lab Results  Component Value Date   CREATININE 0.80 12/18/2022   BUN 9 12/18/2022   NA 139 12/18/2022   K 4.4 12/18/2022   CL 107 12/18/2022   CO2 25 12/18/2022   Lab Results  Component Value Date   ALT  23 02/23/2021   AST 28 02/23/2021   ALKPHOS 114 02/23/2021   BILITOT 1.2 02/23/2021   No results found for: "CHOL", "HDL", "LDLCALC", "LDLDIRECT", "TRIG", "CHOLHDL"  No results found for: "HGBA1C"  Assessment & Plan   1.  ***  Emmanuell Kantz, NP 01/03/2023, 4:53 PM

## 2023-01-04 ENCOUNTER — Encounter: Payer: Self-pay | Admitting: Cardiology

## 2023-01-04 ENCOUNTER — Ambulatory Visit: Payer: Medicare Other | Attending: Cardiology | Admitting: Cardiology

## 2023-02-12 NOTE — Progress Notes (Signed)
Date:  02/13/2023   ID:  Abagail Kitchens, DOB [birth year], MRN [id number]  Patient Location:  [address] [place] [address]   Provider location:   [facility],  office  PCP:  Preston Fleeting, MD  Cardiologist:  [facility]  Chief Complaint  Patient presents with   12 month follow up     Patient c/o having chest pain last weekend that went into this Monday; was relieved with using Ibuprofen. Medications reviewed by the patient verbally.     History of Present Illness:    Kenneth Daniels is a 67 y.o. male past medical history of Smoker.remote, stopped 67 yo carotid dz Epilepsy treatment for his hepatitis C.    mental health issues.  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 C3-4 moderate spinal canal stenosis as well as some neural foraminal stenosis. suffered a myocardial infarction at [facility] in the past,per patient, no records Who presents for routine follow-up of his PAD, carotid stenosis  Last seen by telemetry visit 6/22 URI last week, fever chills, shoulder pain, body aches Took IBP and niquil liquid, liquid upset his stomach Better this week  No cigarettes Currently denies chest pain Reports he is back to normal  Active at baseline, does not check blood pressure at home Long discussion concerning his medications, numerous medications taken off his list after we received notes from primary care Celexa removed, Lyrica removed among others Reports he is not on amlodipine Reports he does not like taking numerous medications, feels he has enough  Discussed blood pressure with him, today relatively well-controlled on no medication He prefers not to start amlodipine and to monitor it for now from home  Lab work reviewed LDL 68 on simvastatin 20 daily  EKG personally reviewed by myself on todays visit Sinus bradycardia rate 50 bpm repolarization abnormality  ER 01/2021 dizziness, headache, nausea,  vomiting, diarrhea, cough and abdominal pain that began yesterday. Gastroenteritis  Prior stress test at [facility] teeth, lost weight, weight 150 to 137 Having dentrures made  Carotid ultrasound April 2020 less than 39% disease bilaterally  Discussed previous MI at [facility] many years ago, Records from this are currently unavailable.  "chest pain, SOB", called EMS, given NTG No prior cardiac catheterization   Prior CV studies:   The following studies were reviewed today:  2014 ICA of a less than 40% stenosis.  Total chol 135, LDL 61 (02/2018) HBA1C 5.4  MRI 2018 cervical  Multilevel spondylosis as described. Progressive stenosis at C3-4, with cord flattening but no abnormal cord signal. Canal diameter 6-7 mm. LEFT greater than RIGHT C4 foraminal narrowing. Modic type 1 endplate appearance, consistent with active ongoing degenerative change. Unremarkable postsurgical change at C6-7, with only mild LEFT C7 foraminal narrowing, and no postsurgical complication. Advanced disc space narrowing at C5-6.  LEFT C6 foraminal narrowing.  2017  Stress test, preop for back surgery The study is normal. This is a low risk study. The left ventricular ejection fraction is normal (55-65%). There was no ST segment deviation noted during stress.   Past Medical History:  Diagnosis Date   Absolute anemia 03/27/2012   Anxiety    Atypical chest pain 04/17/2016   CAD in native artery 04/17/2016   Cerebral infarction (HCC) 11/25/2013   Overview:  IMO Problem List Replacer Jan. 2016    Cervical spinal stenosis 07/06/2015   Essential (primary) hypertension 04/15/2008   Generalized convulsive epilepsy (HCC) 02/20/2008   Headache, migraine  11/25/2013   Heart murmur    Hepatitis    Hep "C"   HLD (hyperlipidemia) 03/27/2012   Hyperlipidemia    Idiopathic localized osteoarthropathy 02/26/2011   Major depressive disorder with single episode 11/14/2009   Myocardial infarction [facility]) 2009   Neuropathic  ulnar nerve 09/29/2015   Seizures (HCC)    Last Seizure 2013   Stroke [facility])    Past Surgical History:  Procedure Laterality Date   ABDOMINAL EXPLORATION SURGERY  2010?   BACK SURGERY     CERVICAL DISC ARTHROPLASTY N/A 09/11/2017   Procedure: CERVICAL ANTERIOR DISC ARTHROPLASTY C3-4 POSSIBLE C4-5;  Surgeon: Venetia Night, MD;  Location: [facility] ORS;  Service: Neurosurgery;  Laterality: N/A;   COLON SURGERY     Colon Resection   COLONOSCOPY WITH PROPOFOL N/A 12/11/2022   Procedure: COLONOSCOPY WITH PROPOFOL;  Surgeon: Regis Bill, MD;  Location: [facility];  Service: Endoscopy;  Laterality: N/A;   HERNIA REPAIR     HOLMIUM LASER APPLICATION N/A 11/20/2016   Procedure: HOLMIUM LASER APPLICATION;  Surgeon: Vanna Scotland, MD;  Location: [facility] ORS;  Service: Urology;  Laterality: N/A;   PROSTATE ABLATION N/A 11/20/2016   Procedure: PROSTATE ABLATION;  Surgeon: Vanna Scotland, MD;  Location: [facility] OR[facility];  Service: Urology;  Laterality: N/A;   SOFT TISSUE TUMOR RESECTION     left thigh   TRANSURETHRAL RESECTION OF PROSTATE N/A 11/20/2016   Procedure: TRANSURETHRAL RESECTION OF THE PROSTATE (TURP);  Surgeon: Vanna Scotland, MD;  Location: [facility] ORS;  Service: Urology;  Laterality: N/A;     Current Outpatient Medications on File Prior to Visit  Medication Sig Dispense Refill   aspirin EC 81 MG tablet Take 1 tablet (81 mg total) by mouth daily. 90 tablet 3   Cholecalciferol (VITAMIN D-3) 5000 units TABS Take 5,000 Units by mouth daily.     phenytoin (DILANTIN) 100 MG ER capsule TAKE 3 CAPSULES IN THE MORNING AND 2 CAPSULES EVERY EVENING  10   phenytoin (DILANTIN) 50 MG tablet Chew 50 mg by mouth 2 (two) times daily.     sildenafil (VIAGRA) 100 MG tablet Take 1 tablet (100 mg total) by mouth daily as needed for erectile dysfunction. 30 tablet 3   simvastatin (ZOCOR) 20 MG tablet TAKE 1 TABLET EVERY EVENING FOR HIGH CHOLESTEROL  3   tamsulosin (FLOMAX) 0.4 MG CAPS capsule Take 1 capsule  (0.4 mg total) by mouth daily. (Patient not taking: Reported on 12/11/2022) 90 capsule 3   No current facility-administered medications on file prior to visit.    Allergies:   Oxycodone, Gabapentin, Lisinopril, and Sulfa antibiotics   Social History   Tobacco Use   Smoking status: Former    Packs/day: 0.75    Years: 38.00    Total pack years: 28.50    Types: Cigarettes    Quit date: 2009    Years since quitting: 15.1   Smokeless tobacco: Never  Vaping Use   Vaping Use: Never used  Substance Use Topics   Alcohol use: No    Alcohol/week: 0.0 standard drinks of alcohol   Drug use: Yes    Types: Marijuana    Comment: use every day      Family Hx: The patient's family history is negative for Bladder Cancer, Prostate cancer, and Kidney cancer.  ROS:   Please see the history of present illness.    Review of Systems  Constitutional: Negative.   HENT: Negative.    Respiratory: Negative.    Cardiovascular: Negative.   Gastrointestinal:  Negative.   Musculoskeletal: Negative.   Neurological: Negative.   Psychiatric/Behavioral: Negative.    All other systems reviewed and are negative.     Labs/Other Tests and Data Reviewed:    Recent Labs: 12/18/2022: BUN 9; Creatinine, Ser 0.80; Hemoglobin 13.2; Platelets 159; Potassium 4.4; Sodium 139   Recent Lipid Panel No results found for: "CHOL", "TRIG", "HDL", "CHOLHDL", "LDLCALC", "LDLDIRECT"  Wt Readings from Last 3 Encounters:  02/13/23 133 lb 6 oz (60.5 kg)  12/18/22 137 lb (62.1 kg)  12/11/22 137 lb 4.5 oz (62.3 kg)     Exam:   BP 130/60 (BP Location: Left Arm, Patient Position: Sitting, Cuff Size: Normal)   Pulse (!) 50   Ht 5\' 10"  (1.778 m)   Wt 133 lb 6 oz (60.5 kg)   SpO2 97%   BMI 19.14 kg/m  Constitutional:  oriented to person, place, and time. No distress.  HENT:  Head: Grossly normal Eyes:  no discharge. No scleral icterus.  Neck: No JVD, no carotid bruits  Cardiovascular: Regular rate and rhythm, no  murmurs appreciated Pulmonary/Chest: Clear to auscultation bilaterally, no wheezes or rails Abdominal: Soft.  no distension.  no tenderness.  Musculoskeletal: Normal range of motion Neurological:  normal muscle tone. Coordination normal. No atrophy Skin: Skin warm and dry Psychiatric: normal affect, pleasant  ASSESSMENT & PLAN:    PAD (peripheral artery disease) (HCC) Very mild carotid disease in April 2020 Continue statin, no further imaging needed No diabetes, non-smoker  Amaurosis fugax of right eye On aspirin, statin  Cigarette smoker Reports he does not smoke cigarettes Cautioned him against other drugs of abuse  Coronary artery disease Unclear history, nothing in the [facility] records but reports he was taken by EMS to the hospital for chest pain, was given nitro, He reports having cardiac catheterization but nothing in the system Currently asymptomatic No further ischemic workup at this time  Viral URI Symptoms last week with general body ache, chills fever, symptoms have resolved this past weekend   Total encounter time more than 30 minutes  Greater than 50% was spent in counseling and coordination of care with the patient  Signed, Julien Nordmann, MD  02/13/2023 9:07 AM    [facility] [address], [place], [place] [address]

## 2023-02-13 ENCOUNTER — Ambulatory Visit: Payer: Medicare HMO | Attending: Cardiovascular Disease | Admitting: Cardiovascular Disease

## 2023-02-13 ENCOUNTER — Encounter: Payer: Self-pay | Admitting: Cardiovascular Disease

## 2023-02-13 VITALS — BP 130/60 | HR 50 | Ht 70.0 in | Wt 133.4 lb

## 2023-02-13 DIAGNOSIS — E782 Mixed hyperlipidemia: Secondary | ICD-10-CM

## 2023-02-13 DIAGNOSIS — I10 Essential (primary) hypertension: Secondary | ICD-10-CM

## 2023-02-13 DIAGNOSIS — I6529 Occlusion and stenosis of unspecified carotid artery: Secondary | ICD-10-CM

## 2023-02-13 DIAGNOSIS — I739 Peripheral vascular disease, unspecified: Secondary | ICD-10-CM

## 2023-02-13 DIAGNOSIS — I2510 Atherosclerotic heart disease of native coronary artery without angina pectoris: Secondary | ICD-10-CM | POA: Diagnosis not present

## 2023-02-13 NOTE — Patient Instructions (Signed)
Medication Instructions:  No changes  If you need a refill on your cardiac medications before your next appointment, please call your pharmacy.   Lab work: No new labs needed  Testing/Procedures: No new testing needed  Follow-Up: At [facility], you and your health needs are our priority.  As part of our continuing mission to provide you with exceptional heart care, we have created designated Provider Care Teams.  These Care Teams include your primary Cardiologist (physician) and Advanced Practice Providers (APPs -  Physician Assistants and Nurse Practitioners) who all work together to provide you with the care you need, when you need it.  You will need a follow up appointment in 12 months  Providers on your designated Care Team:   Christopher Berge, NP Ryan Dunn, PA-C Cadence Furth, PA-C  COVID-19 Vaccine Information can be found at: [email or web address] For questions related to vaccine distribution or appointments, please email [email or web address] or call [phone].   

## 2023-07-29 ENCOUNTER — Encounter: Payer: Self-pay | Admitting: Cardiovascular Disease

## 2023-09-16 ENCOUNTER — Other Ambulatory Visit: Payer: Self-pay

## 2023-09-16 DIAGNOSIS — N138 Other obstructive and reflux uropathy: Secondary | ICD-10-CM

## 2023-09-17 ENCOUNTER — Encounter: Payer: Self-pay | Admitting: Urology

## 2023-09-17 ENCOUNTER — Other Ambulatory Visit: Payer: Medicare Other

## 2023-09-23 ENCOUNTER — Other Ambulatory Visit: Payer: Self-pay

## 2023-09-23 DIAGNOSIS — N529 Male erectile dysfunction, unspecified: Secondary | ICD-10-CM

## 2023-09-23 MED ORDER — SILDENAFIL CITRATE 100 MG PO TABS: 100.0000 mg | ORAL_TABLET | Freq: Every day | ORAL | 0 refills | Status: DC | PRN

## 2023-09-23 NOTE — Telephone Encounter (Signed)
Pt requesting refill of Sildenafil.   Annual appt scheduled for 9/25. Refill sent in to [facility]. #[phone] refills. Pt reminded of appt.

## 2023-09-24 NOTE — Progress Notes (Unsigned)
09/25/2023 9:23 AM   Abagail Kitchens 19-Sep-1956 [id number]  Referring provider: Preston Fleeting, MD 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 Prostate nodule -prostate biopsy 08/2018 benign  3. BPH with LU TS -Tamsulosin 0.4 mg daily  4. Prostate cancer screening -PSA pending -cousin died of prostate cancer, age 6 -African ancestry -prostate biopsy (2019) benign  5. ED -contributing factors of age, history of smoking, MI, stroke, CAD, PAD, BPH, HTN, HLD, depression and anxiety -sildenafil 100 mg, on-demand-dosing  Chief Complaint  Patient presents with   Follow-up    HPI: PUAL CHEADLE is a 67 y.o. male who presents today for follow up.  Previous Records reviewed.  I PSS 10/2  No complaints.  Patient denies any modifying or aggravating factors.  Patient denies any recent UTI's, gross hematuria, dysuria or suprapubic/flank pain.  Patient denies any fevers, chills, nausea or vomiting.   He is taking tamsulosin 0.4 mg daily.   IPSS     Row Name 09/25/23 0900         International Prostate Symptom Score   How often have you had the sensation of not emptying your bladder? Less than half the time     How often have you had to urinate less than every two hours? Less than half the time     How often have you found you stopped and started again several times when you urinated? Less than 1 in 5 times     How often have you found it difficult to postpone urination? Less than half the time     How often have you had a weak urinary stream? Less than 1 in 5 times     How often have you had to strain to start urination? Not at All     How many times did you typically get up at night to urinate? 2 Times     Total IPSS Score 10       Quality of Life due to  urinary symptoms   If you were to spend the rest of your life with your urinary condition just the way it is now how would you feel about that? Mostly Satisfied             Score:  1-7 Mild 8-19 Moderate 20-35 Severe  SHIM 15  Patient not having spontaneous erections.  He denies any pain or curvature with erections.     SHIM     Row Name 09/25/23 0909         SHIM: Over the last 6 months:   How do you rate your confidence that you could get and keep an erection? Moderate     When you had erections with sexual stimulation, how often were your erections hard enough for penetration (entering your partner)? Sometimes (about half the time)     During sexual intercourse, how often were you able to maintain your erection after you had penetrated (entered) your partner? Sometimes (about half the time)     During sexual intercourse, how difficult was it to maintain your erection to completion of intercourse? Difficult     When you attempted sexual intercourse, how often was it satisfactory for you? Sometimes (about half the time)       SHIM Total Score   SHIM  15              Score: 1-7 Severe ED 8-11 Moderate ED 12-16 Mild-Moderate ED 17-21 Mild ED 22-25 No ED   PMH: Past Medical History:  Diagnosis Date   Absolute anemia 03/27/2012   Anxiety    Atypical chest pain 04/17/2016   CAD in native artery 04/17/2016   Cerebral infarction (HCC) 11/25/2013   Overview:  IMO Problem List Replacer Jan. 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   BACK SURGERY     CERVICAL DISC ARTHROPLASTY N/A 09/11/2017   Procedure: CERVICAL ANTERIOR DISC ARTHROPLASTY C3-4 POSSIBLE C4-5;  Surgeon: Venetia Night, MD;  Location: [facility] ORS;  Service: Neurosurgery;  Laterality: N/A;   COLON SURGERY     Colon Resection   COLONOSCOPY WITH PROPOFOL N/A 12/11/2022   Procedure: COLONOSCOPY WITH PROPOFOL;  Surgeon: Regis Bill, MD;  Location: [facility] ENDOSCOPY;  Service: Endoscopy;  Laterality: N/A;   HERNIA REPAIR     HOLMIUM LASER APPLICATION N/A 11/20/2016   Procedure: HOLMIUM LASER APPLICATION;  Surgeon: Vanna Scotland, MD;  Location: [facility] ORS;  Service: Urology;  Laterality: N/A;   PROSTATE ABLATION N/A 11/20/2016   Procedure: PROSTATE ABLATION;  Surgeon: Vanna Scotland, MD;  Location: [facility] ORS;  Service: Urology;  Laterality: N/A;   SOFT TISSUE TUMOR RESECTION     left thigh   TRANSURETHRAL RESECTION OF PROSTATE N/A 11/20/2016   Procedure: TRANSURETHRAL RESECTION OF THE PROSTATE (TURP);  Surgeon: Vanna Scotland, MD;  Location: [facility] ORS;  Service: Urology;  Laterality: N/A;    Home Medications:  Allergies as of 09/25/2023       Reactions   Oxycodone Other (See Comments)   Other reaction(s): Confusion "Had me all messed up" "Had me all messed up"   Gabapentin Other (See Comments)   Lisinopril    Sulfa Antibiotics Rash        Medication List        Accurate as of September 25, 2023  9:23 AM. If you have any questions, ask your nurse or doctor.          aspirin EC 81 MG tablet Take 1 tablet (81 mg total) by mouth daily.   celecoxib 100 MG capsule Commonly known as: CELEBREX Take 100 mg by mouth 2 (two) times daily.   meloxicam 15 MG tablet Commonly known as: MOBIC Take by mouth.   phenytoin 100 MG ER capsule Commonly known as: DILANTIN TAKE 3 CAPSULES IN THE MORNING AND 2 CAPSULES EVERY  EVENING   phenytoin 50 MG tablet Commonly known as: DILANTIN Chew 50 mg by mouth 2 (two) times daily.   pregabalin 75 MG capsule Commonly known as: LYRICA TAKE 1 CAPSULE THREE TIMES A DAY AS NEEDED   sildenafil 100 MG tablet Commonly known as: Viagra Take 1 tablet (100 mg total) by mouth daily as needed for erectile dysfunction.   simvastatin 20 MG  tablet Commonly known as: ZOCOR TAKE 1 TABLET EVERY EVENING FOR HIGH CHOLESTEROL   tamsulosin 0.4 MG Caps capsule Commonly known as: FLOMAX Take 1 capsule (0.4 mg total) by mouth daily.   Vitamin D-3 125 MCG (5000 UT) Tabs Take 5,000 Units by mouth daily.        Allergies:  Allergies  Allergen Reactions   Oxycodone Other (See Comments)    Other reaction(s): Confusion "Had me all messed up" "Had me all messed up"   Gabapentin Other (See Comments)   Lisinopril    Sulfa Antibiotics Rash    Family History: Family History  Problem Relation Age of Onset   Bladder Cancer Neg Hx    Prostate cancer Neg Hx    Kidney cancer Neg Hx     Social History:  reports that he quit smoking about 15 years ago. His smoking use included cigarettes. He started smoking about 53 years ago. He has a 28.5 pack-year smoking history. He has never used smokeless tobacco. He reports current drug use. Drug: Marijuana. He reports that he does not drink alcohol.  ROS: For pertinent review of systems please refer to history of present illness  Physical Exam: BP (!) 146/75   Pulse 60   Constitutional:  Well nourished. Alert and oriented, No acute distress. HEENT: [place] AT, moist mucus membranes.  Trachea midline Cardiovascular: No clubbing, cyanosis, or edema. Respiratory: Normal respiratory effort, no increased work of breathing. GU: No CVA tenderness.  No bladder fullness or masses.  Patient with uncircumcised phallus. Foreskin easily retracted  Urethral meatus is patent.  No penile discharge. No penile lesions or rashes. Scrotum without lesions,  cysts, rashes and/or edema.  Testicles are located scrotally bilaterally. No masses are appreciated in the testicles. Left and right epididymis are normal. Rectal: Patient with  normal sphincter tone. Anus and perineum without scarring or rashes. No rectal masses are appreciated. Prostate is approximately 60 + grams, firm in the right lobe, no nodules are appreciated. Seminal vesicles could not be palpated  Neurologic: Grossly intact, no focal deficits, moving all 4 extremities. Psychiatric: Normal mood and affect.    Laboratory Data: Serum creatinine 0.81, eGFR 97 in January 2024 Results for orders placed or performed during the hospital encounter of 12/18/22  Basic metabolic panel  Result Value Ref Range   Sodium 139 135 - 145 mmol/L   Potassium 4.4 3.5 - 5.1 mmol/L   Chloride 107 98 - 111 mmol/L   CO2 25 22 - 32 mmol/L   Glucose, Bld 112 (H) 70 - 99 mg/dL   BUN 9 8 - 23 mg/dL   Creatinine, Ser 1.61 0.61 - 1.24 mg/dL   Calcium 9.5 8.9 - 09.6 mg/dL   GFR, Estimated >04 >54 mL/min   Anion gap 7 5 - 15  CBC with Differential/Platelet  Result Value Ref Range   WBC 10.2 4.0 - 10.5 K/uL   RBC 4.40 4.22 - 5.81 MIL/uL   Hemoglobin 13.2 13.0 - 17.0 g/dL   HCT 09.8 11.9 - 14.7 %   MCV 91.4 80.0 - 100.0 fL   MCH 30.0 26.0 - 34.0 pg   MCHC 32.8 30.0 - 36.0 g/dL   RDW 82.9 56.2 - 13.0 %   Platelets 159 150 - 400 K/uL   nRBC 0.0 0.0 - 0.2 %   Neutrophils Relative % 70 %   Neutro Abs 7.2 1.7 - 7.7 K/uL   Lymphocytes Relative 19 %   Lymphs Abs 1.9 0.7 - 4.0 K/uL  Monocytes Relative 11 %   Monocytes Absolute 1.1 (H) 0.1 - 1.0 K/uL   Eosinophils Relative 0 %   Eosinophils Absolute 0.0 0.0 - 0.5 K/uL   Basophils Relative 0 %   Basophils Absolute 0.0 0.0 - 0.1 K/uL   Immature Granulocytes 0 %   Abs Immature Granulocytes 0.03 0.00 - 0.07 K/uL  Troponin I (High Sensitivity)  Result Value Ref Range   Troponin I (High Sensitivity) 57 (H) <18 ng/L  Troponin I (High Sensitivity)  Result  Value Ref Range   Troponin I (High Sensitivity) 51 (H) <18 ng/L  I have reviewed the labs  Pertinent imaging: N/A  Assessment & Plan:    1.  BPH with LUTS -At goal -Continue tamsulosin 0.4 mg daily  2. Prostate cancer screening  -prostate biopsy negative 2019 -African ancestry -cousin died of prostate cancer, age 3 -PSA pending   3. ED -Not sexually active as he has been in the past -Continue sildenafil 100 mg on demand dosing  Return in about 1 year (around 09/24/2024) for IPSS, SHIM, PSA and exam.  Michiel Cowboy, PA-C  [facility] [address] [place], [place] [address] [phone]

## 2023-09-25 ENCOUNTER — Encounter: Payer: Self-pay | Admitting: Urology

## 2023-09-25 ENCOUNTER — Ambulatory Visit (INDEPENDENT_AMBULATORY_CARE_PROVIDER_SITE_OTHER): Payer: Medicare HMO | Admitting: Urology

## 2023-09-25 VITALS — BP 146/75 | HR 60

## 2023-09-25 DIAGNOSIS — N401 Enlarged prostate with lower urinary tract symptoms: Secondary | ICD-10-CM

## 2023-09-25 DIAGNOSIS — N529 Male erectile dysfunction, unspecified: Secondary | ICD-10-CM

## 2023-09-25 DIAGNOSIS — Z125 Encounter for screening for malignant neoplasm of prostate: Secondary | ICD-10-CM | POA: Diagnosis not present

## 2023-09-25 DIAGNOSIS — N138 Other obstructive and reflux uropathy: Secondary | ICD-10-CM

## 2023-09-25 MED ORDER — TAMSULOSIN HCL 0.4 MG PO CAPS: 0.4000 mg | ORAL_CAPSULE | Freq: Every day | ORAL | 3 refills | Status: DC

## 2023-09-25 MED ORDER — SILDENAFIL CITRATE 100 MG PO TABS: 100.0000 mg | ORAL_TABLET | Freq: Every day | ORAL | 0 refills | Status: DC | PRN

## 2023-09-26 LAB — PSA: Prostate Specific Ag, Serum: 1.5 ng/mL (ref 0.0–4.0)

## 2023-12-27 ENCOUNTER — Other Ambulatory Visit: Payer: Self-pay | Admitting: Urology

## 2023-12-27 DIAGNOSIS — N529 Male erectile dysfunction, unspecified: Secondary | ICD-10-CM

## 2024-02-04 DIAGNOSIS — Z0130 Encounter for examination of blood pressure without abnormal findings: Secondary | ICD-10-CM | POA: Diagnosis not present

## 2024-02-04 DIAGNOSIS — M25511 Pain in right shoulder: Secondary | ICD-10-CM | POA: Diagnosis not present

## 2024-02-04 DIAGNOSIS — Z7185 Encounter for immunization safety counseling: Secondary | ICD-10-CM | POA: Diagnosis not present

## 2024-02-04 DIAGNOSIS — Z87891 Personal history of nicotine dependence: Secondary | ICD-10-CM | POA: Diagnosis not present

## 2024-02-04 DIAGNOSIS — Z1389 Encounter for screening for other disorder: Secondary | ICD-10-CM | POA: Diagnosis not present

## 2024-02-04 DIAGNOSIS — I10 Essential (primary) hypertension: Secondary | ICD-10-CM | POA: Diagnosis not present

## 2024-02-14 DIAGNOSIS — M75111 Incomplete rotator cuff tear or rupture of right shoulder, not specified as traumatic: Secondary | ICD-10-CM | POA: Diagnosis not present

## 2024-02-14 DIAGNOSIS — M25511 Pain in right shoulder: Secondary | ICD-10-CM | POA: Diagnosis not present

## 2024-02-17 ENCOUNTER — Other Ambulatory Visit: Payer: Self-pay | Admitting: Orthopedic Surgery

## 2024-02-17 DIAGNOSIS — M25511 Pain in right shoulder: Secondary | ICD-10-CM

## 2024-02-17 DIAGNOSIS — M75111 Incomplete rotator cuff tear or rupture of right shoulder, not specified as traumatic: Secondary | ICD-10-CM

## 2024-02-22 ENCOUNTER — Ambulatory Visit
Admission: RE | Admit: 2024-02-22 | Discharge: 2024-02-22 | Disposition: A | Discharge status: Home / Self Care | Payer: Medicare HMO | Source: Ambulatory Visit | Attending: Orthopedic Surgery | Admitting: Orthopedic Surgery

## 2024-02-22 DIAGNOSIS — M25511 Pain in right shoulder: Secondary | ICD-10-CM | POA: Diagnosis not present

## 2024-02-22 DIAGNOSIS — M129 Arthropathy, unspecified: Secondary | ICD-10-CM | POA: Diagnosis not present

## 2024-02-22 DIAGNOSIS — M75111 Incomplete rotator cuff tear or rupture of right shoulder, not specified as traumatic: Secondary | ICD-10-CM | POA: Diagnosis not present

## 2024-02-22 DIAGNOSIS — M7581 Other shoulder lesions, right shoulder: Secondary | ICD-10-CM | POA: Diagnosis not present

## 2024-02-22 DIAGNOSIS — M65911 Unspecified synovitis and tenosynovitis, right shoulder: Secondary | ICD-10-CM | POA: Diagnosis not present

## 2024-02-25 ENCOUNTER — Other Ambulatory Visit: Payer: Self-pay

## 2024-02-25 ENCOUNTER — Emergency Department: Payer: Medicare HMO

## 2024-02-25 ENCOUNTER — Emergency Department
Admission: EM | Admit: 2024-02-25 | Discharge: 2024-02-25 | Discharge status: Against Medical Advice | Payer: Medicare HMO | Attending: Emergency Medicine | Admitting: Emergency Medicine

## 2024-02-25 ENCOUNTER — Encounter: Payer: Self-pay | Admitting: Emergency Medicine

## 2024-02-25 DIAGNOSIS — I700 Atherosclerosis of aorta: Secondary | ICD-10-CM | POA: Diagnosis not present

## 2024-02-25 DIAGNOSIS — R1084 Generalized abdominal pain: Secondary | ICD-10-CM | POA: Diagnosis not present

## 2024-02-25 DIAGNOSIS — G8929 Other chronic pain: Secondary | ICD-10-CM | POA: Insufficient documentation

## 2024-02-25 DIAGNOSIS — M545 Low back pain, unspecified: Secondary | ICD-10-CM | POA: Diagnosis not present

## 2024-02-25 DIAGNOSIS — R64 Cachexia: Secondary | ICD-10-CM | POA: Diagnosis not present

## 2024-02-25 DIAGNOSIS — R079 Chest pain, unspecified: Secondary | ICD-10-CM | POA: Insufficient documentation

## 2024-02-25 DIAGNOSIS — I10 Essential (primary) hypertension: Secondary | ICD-10-CM | POA: Diagnosis not present

## 2024-02-25 LAB — COMPREHENSIVE METABOLIC PANEL
ALT: 16 U/L (ref 0–44)
AST: 23 U/L (ref 15–41)
Albumin: 3.3 g/dL — ABNORMAL LOW (ref 3.5–5.0)
Alkaline Phosphatase: 104 U/L (ref 38–126)
Anion gap: 10 (ref 5–15)
BUN: 8 mg/dL (ref 8–23)
CO2: 22 mmol/L (ref 22–32)
Calcium: 8.9 mg/dL (ref 8.9–10.3)
Chloride: 96 mmol/L — ABNORMAL LOW (ref 98–111)
Creatinine, Ser: 0.57 mg/dL — ABNORMAL LOW (ref 0.61–1.24)
GFR, Estimated: >60 mL/min (ref >60)
Glucose, Bld: 99 mg/dL (ref 70–99)
Potassium: 3.6 mmol/L (ref 3.5–5.1)
Sodium: 128 mmol/L — ABNORMAL LOW (ref 135–145)
Total Bilirubin: <0.2 mg/dL (ref 0.0–1.2)
Total Protein: 8.2 g/dL — ABNORMAL HIGH (ref 6.5–8.1)

## 2024-02-25 LAB — TROPONIN I (HIGH SENSITIVITY): Troponin I (High Sensitivity): 28 ng/L — ABNORMAL HIGH (ref <18)

## 2024-02-25 LAB — LIPASE, BLOOD: Lipase: 25 U/L (ref 11–51)

## 2024-02-25 LAB — URINALYSIS, ROUTINE W REFLEX MICROSCOPIC
Bacteria, UA: NONE SEEN
Bilirubin Urine: NEGATIVE
Glucose, UA: NEGATIVE mg/dL
Ketones, ur: NEGATIVE mg/dL
Leukocytes,Ua: NEGATIVE
Nitrite: NEGATIVE
Protein, ur: NEGATIVE mg/dL
Specific Gravity, Urine: 1.021 (ref 1.005–1.030)
pH: 6 (ref 5.0–8.0)

## 2024-02-25 LAB — CBC
HCT: 35.7 % — ABNORMAL LOW (ref 39.0–52.0)
Hemoglobin: 12 g/dL — ABNORMAL LOW (ref 13.0–17.0)
MCH: 29.1 pg (ref 26.0–34.0)
MCHC: 33.6 g/dL (ref 30.0–36.0)
MCV: 86.7 fL (ref 80.0–100.0)
Platelets: 369 10*3/uL (ref 150–400)
RBC: 4.12 MIL/uL — ABNORMAL LOW (ref 4.22–5.81)
RDW: 12.8 % (ref 11.5–15.5)
WBC: 10.5 10*3/uL (ref 4.0–10.5)
nRBC: 0 % (ref 0.0–0.2)

## 2024-02-25 MED ORDER — KETOROLAC TROMETHAMINE 15 MG/ML IJ SOLN
15.0000 mg | Freq: Once | INTRAMUSCULAR | Status: AC
  Administered 2024-02-25: 15 mg via INTRAVENOUS
  Filled 2024-02-25: qty 1

## 2024-02-25 MED ORDER — MORPHINE SULFATE (PF) 4 MG/ML IV SOLN: 4.0000 mg | Freq: Once | INTRAVENOUS | Status: DC

## 2024-02-25 MED ORDER — SODIUM CHLORIDE 0.9 % IV BOLUS
1000.0000 mL | Freq: Once | INTRAVENOUS | Status: AC
  Administered 2024-02-25: 1000 mL via INTRAVENOUS

## 2024-02-25 MED ORDER — IOHEXOL 350 MG/ML SOLN
100.0000 mL | Freq: Once | INTRAVENOUS | Status: AC | PRN
  Administered 2024-02-25: 100 mL via INTRAVENOUS

## 2024-02-25 NOTE — ED Triage Notes (Signed)
 Patient to ED via POV for back pain since before Thanksgiving. States pain radiates into left shoulder and chest since yesterday. Describes pain as constant.

## 2024-02-25 NOTE — ED Provider Notes (Signed)
 [facility] Provider Note    Event Date/Time   First MD Initiated Contact with Patient 02/25/24 [phone]     (approximate)   History   Back Pain   HPI Kenneth Daniels is a 68 y.o. male with history of HLD, epilepsy presenting today for chest and abdominal pain.  Patient states since Thanksgiving he has had neck pain and upper/lower back pain.  He has seen his primary care provider and orthopedics several times for this.  Recently got an MRI of his right shoulder.  Yesterday he states he had new onset of sharp generalized abdominal pain.  Radiates up to bilateral shoulders as well as slight pain in the left lower side of his chest.  Intermittently feels like he has shortness of breath.  Otherwise denies nausea, vomiting, fever, chills, diarrhea, constipation, dysuria.  Notes prior history of small bowel obstruction requiring surgery.  Has been taking ibuprofen without significant relief.     Physical Exam   Triage Vital Signs: ED Triage Vitals  Encounter Vitals Group     BP 02/25/24 0758 (!) 180/81     Systolic BP Percentile --      Diastolic BP Percentile --      Pulse Rate 02/25/24 0758 (!) 56     Resp 02/25/24 0758 18     Temp 02/25/24 0759 (!) 97.5 F (36.4 C)     Temp Source 02/25/24 0759 Oral     SpO2 02/25/24 0758 100 %     Weight 02/25/24 0758 140 lb (63.5 kg)     Height 02/25/24 0758 5\' 10"  (1.778 m)     Head Circumference --      Peak Flow --      Pain Score 02/25/24 0758 8     Pain Loc --      Pain Education --      Exclude from Growth Chart --     Most recent vital signs: Vitals:   02/25/24 0759 02/25/24 0815  BP:    Pulse:  61  Resp:  20  Temp: (!) 97.5 F (36.4 C)   SpO2:  99%   Physical Exam: I have reviewed the vital signs and nursing notes. General: Awake, alert, no acute distress.  Nontoxic appearing. Head:  Atraumatic, normocephalic.   ENT:  EOM intact, PERRL. Oral mucosa is pink and moist with no lesions. Neck: Neck is  supple with full range of motion, No meningeal signs. Cardiovascular:  RRR, No murmurs. Peripheral pulses palpable and equal bilaterally. Respiratory:  Symmetrical chest wall expansion.  No rhonchi, rales, or wheezes.  Good air movement throughout.  No use of accessory muscles.   Musculoskeletal:  No cyanosis or edema. Moving extremities with full ROM.  Tenderness palpation throughout entire back Abdomen:  Soft, tender to palpation throughout entire abdomen, nondistended. Neuro:  GCS 15, moving all four extremities, interacting appropriately. Speech clear. Psych:  Calm, appropriate.   Skin:  Warm, dry, no rash.    ED Results / Procedures / Treatments   Labs (all labs ordered are listed, but only abnormal results are displayed) Labs Reviewed  CBC - Abnormal; Notable for the following components:      Result Value   RBC 4.12 (*)    Hemoglobin 12.0 (*)    HCT 35.7 (*)    All other components within normal limits  COMPREHENSIVE METABOLIC PANEL - Abnormal; Notable for the following components:   Sodium 128 (*)    Chloride 96 (*)  Creatinine, Ser 0.57 (*)    Total Protein 8.2 (*)    Albumin 3.3 (*)    All other components within normal limits  URINALYSIS, ROUTINE W REFLEX MICROSCOPIC - Abnormal; Notable for the following components:   Color, Urine STRAW (*)    APPearance CLEAR (*)    Hgb urine dipstick SMALL (*)    All other components within normal limits  TROPONIN I (HIGH SENSITIVITY) - Abnormal; Notable for the following components:   Troponin I (High Sensitivity) 28 (*)    All other components within normal limits  LIPASE, BLOOD  TROPONIN I (HIGH SENSITIVITY)     EKG My EKG interpretation: Rate of 60, normal sinus rhythm, normal axis, normal intervals.  No acute ST elevations or depressions   RADIOLOGY Independently interpreted chest x-ray with no acute pathology.  CTA chest/abdomen/pelvis pending at time of AMA   PROCEDURES:  Critical Care performed:  No  Procedures   MEDICATIONS ORDERED IN ED: Medications  morphine (PF) 4 MG/ML injection 4 mg (4 mg Intravenous Not Given 02/25/24 0833)  sodium chloride 0.9 % bolus 1,000 mL (0 mLs Intravenous Stopped 02/25/24 1034)  ketorolac (TORADOL) 15 MG/ML injection 15 mg (15 mg Intravenous Given 02/25/24 0900)  iohexol (OMNIPAQUE) 350 MG/ML injection 100 mL (100 mLs Intravenous Contrast Given 02/25/24 0911)     IMPRESSION / MDM / ASSESSMENT AND PLAN / ED COURSE  I reviewed the triage vital signs and the nursing notes.                              Differential diagnosis includes, but is not limited to, muscle spasms, colitis, enteritis, SBO, pancreatitis, dissection, lower suspicion ACS  Patient's presentation is most consistent with acute complicated illness / injury requiring diagnostic workup.  Patient is a 68 year old male presenting today for chronic back pain associated with new lower abdominal pain.  On arrival he has hypertension but otherwise vital signs stable.  Tender palpation throughout the abdomen.  No other associated symptoms.  EKG reassuring.  First troponin at 28 which is lower than previous values and no chest pain at this time.  CBC, lipase otherwise reassuring.  CMP with mild hyponatremia and hypochloremia likely suspected secondary to his recent dehydration and he was given 1 L fluids.  He was also given Toradol for pain control.  CTA chest/abdomen/pelvis was ordered for further evaluation of potential emergent pathology like dissection versus AAA.  No obvious AAA per my interpretation.  Patient called out to nurse stating he wanted to leave at this time.  He stated he felt better and did not want to wait for results.  I emphasized that I wanted to wait till results of CT chest/abdomen/pelvis as well as repeat troponin.  Patient did not want a wait any longer.  Patient had capacity and understood the risks of leaving.  Patient signed out AMA.  The patient is on the cardiac monitor to  evaluate for evidence of arrhythmia and/or significant heart rate changes. Clinical Course as of 02/25/24 1047  Tue Feb 25, 2024  0918 [facility] Chest [place] 1 View Independently interpreted with no obvious pneumonia or pneumothorax [DW]  1032 Patient requesting to leave at this time.  States his symptoms are better and he does not want to stay in the hospital any longer.  Emphasized that his imaging was not back yet and we could not rule out any life-threatening or emergent pathology.  Patient understands the risks  and had capacity.  Patient signed out AMA [DW]    Clinical Course User Index [DW] Janith Lima, MD     FINAL CLINICAL IMPRESSION(S) / ED DIAGNOSES   Final diagnoses:  Generalized abdominal pain  Chronic low back pain without sciatica, unspecified back pain laterality     Rx / DC Orders   ED Discharge Orders     None        Note:  This document was prepared using Dragon voice recognition software and may include unintentional dictation errors.   Janith Lima, MD 02/25/24 1050

## 2024-02-25 NOTE — ED Notes (Signed)
 Pt requesting to leave, Dr Anner Crete notified, signing out AMA. Steady gait, NAD noted

## 2024-02-26 ENCOUNTER — Other Ambulatory Visit: Payer: Self-pay

## 2024-02-26 ENCOUNTER — Emergency Department
Admission: EM | Admit: 2024-02-26 | Discharge: 2024-02-26 | Disposition: A | Discharge status: Home / Self Care | Payer: Medicare HMO | Attending: Emergency Medicine | Admitting: Emergency Medicine

## 2024-02-26 DIAGNOSIS — G8929 Other chronic pain: Secondary | ICD-10-CM | POA: Insufficient documentation

## 2024-02-26 DIAGNOSIS — I10 Essential (primary) hypertension: Secondary | ICD-10-CM | POA: Diagnosis not present

## 2024-02-26 DIAGNOSIS — M545 Low back pain, unspecified: Secondary | ICD-10-CM | POA: Insufficient documentation

## 2024-02-26 DIAGNOSIS — M549 Dorsalgia, unspecified: Secondary | ICD-10-CM | POA: Diagnosis present

## 2024-02-26 DIAGNOSIS — M5459 Other low back pain: Secondary | ICD-10-CM | POA: Diagnosis not present

## 2024-02-26 MED ORDER — HYDROCODONE-ACETAMINOPHEN 5-325 MG PO TABS: 1.0000 | ORAL_TABLET | ORAL | 0 refills | Status: AC | PRN

## 2024-02-26 MED ORDER — HYDROCODONE-ACETAMINOPHEN 5-325 MG PO TABS
1.0000 | ORAL_TABLET | Freq: Once | ORAL | Status: AC
  Administered 2024-02-26: 1 via ORAL
  Filled 2024-02-26: qty 1

## 2024-02-26 NOTE — ED Notes (Signed)
 Pt reports bilateral lower back pain since thanksgiving. Pain is progressive, denies injury. Pt been taking ibuprofen and muscle relaxers w/some relief. Pain is worse and was told to come back,

## 2024-02-26 NOTE — Discharge Instructions (Signed)
 As we discussed please take your pain medication as needed for significant pain, but only as prescribed.  Otherwise please take Tylenol as written on the box.  Limit your total acetaminophen intake to 4 g/day or less.  Do not drive or drink alcohol while taking this medication.  Please increase your dietary intake of sodium and calorie/food in general.  Please follow-up with your doctor in 1 week to have your labs rechecked.

## 2024-02-26 NOTE — ED Triage Notes (Signed)
 Pt reports back pain that began before thanksgiving last year, pt states he was seen here yesterday for same and had labs drawn and xr taken. Pt denies new injury to back. Pt denies bowel incontinence.

## 2024-02-26 NOTE — ED Notes (Signed)
 Report received from [facility], [place].

## 2024-02-26 NOTE — ED Notes (Addendum)
 Pt ambulatory to the restroom at this time. No assistance required.  Dr. Lenard Lance at bedside for evalutation at this time.

## 2024-02-26 NOTE — ED Provider Notes (Signed)
 [facility] Provider Note    Event Date/Time   First MD Initiated Contact with Patient 02/26/24 0703     (approximate)  History   Chief Complaint: Back Pain  HPI  Kenneth Daniels is a 68 y.o. male with a past medical history of anxiety, epilepsy, hypertension, hyperlipidemia, presents to the emergency department for lower back pain.  According to the patient he has been experiencing lower back pain over the last for 5 months.  He denies any initiating event that he can recall.  States initially the pain was also radiating down his left leg however he states that is since gone away but he continues to have back pain that waxes and wanes in severity and has been more significant recently.  Patient was seen in the emergency department yesterday had a significant workup including a CTA of the chest abdomen and pelvis.  No significant finding.   Physical Exam   Triage Vital Signs: ED Triage Vitals  Encounter Vitals Group     BP 02/26/24 0511 (!) 165/87     Systolic BP Percentile --      Diastolic BP Percentile --      Pulse Rate 02/26/24 0511 (!) 59     Resp 02/26/24 0511 20     Temp 02/26/24 0511 97.6 F (36.4 C)     Temp Source 02/26/24 0511 Oral     SpO2 02/26/24 0511 100 %     Weight 02/26/24 0511 130 lb (59 kg)     Height 02/26/24 0511 5\' 10"  (1.778 m)     Head Circumference --      Peak Flow --      Pain Score 02/26/24 0510 8     Pain Loc --      Pain Education --      Exclude from Growth Chart --     Most recent vital signs: Vitals:   02/26/24 0511  BP: (!) 165/87  Pulse: (!) 59  Resp: 20  Temp: 97.6 F (36.4 C)  SpO2: 100%    General: Awake, no distress.  Thin build. CV:  Good peripheral perfusion.  Regular rate and rhythm  Resp:  Normal effort.  Equal breath sounds bilaterally.  Abd:  No distention.  Soft, nontender.  No rebound or guarding.  ED Results / Procedures / Treatments    MEDICATIONS ORDERED IN ED: Medications - No  data to display   IMPRESSION / MDM / ASSESSMENT AND PLAN / ED COURSE  I reviewed the triage vital signs and the nursing notes.  Patient's presentation is most consistent with acute presentation with potential threat to life or bodily function.  Patient presents to the emergency department for continued lower back pain ongoing over the last 4 or 5 months.  Patient states initially with sciatica symptoms however that has since resolved.  Denies any numbness or weakness of either leg denies any incontinence.  Patient was seen in the emergency department yesterday had a fairly significant workup with a negative CT angiography of the chest/abdomen/pelvis, does show degenerative changes in the back but no acute finding.  Patient's lab work largely at baseline for the patient including a slightly elevated troponin.  Patient denies any chest pain or shortness of breath.  Patient did have however slight hyponatremia.  Patient denies any diuretics denies any recent nausea vomiting or diarrhea or urinary issues.  Patient has rather thin/frail appearing, states he does not eat very much, suspect the hyponatremia could be more  dietary denies any recent changes to his medications.  I asked that the patient try to increase his dietary sodium intake and follow-up with his doctor Monday or Tuesday of this coming week to recheck his labs.  We will place the patient on a very short course of pain medication.  Patient states gabapentin allergy and does not tolerate ibuprofen products.  We will place on a short course of Norco have the patient follow-up with his doctor.  FINAL CLINICAL IMPRESSION(S) / ED DIAGNOSES   Low back pain   Note:  This document was prepared using Dragon voice recognition software and may include unintentional dictation errors.   Minna Antis, MD 02/26/24 0730

## 2024-02-28 ENCOUNTER — Other Ambulatory Visit: Payer: Self-pay

## 2024-02-28 ENCOUNTER — Emergency Department: Payer: No Typology Code available for payment source

## 2024-02-28 ENCOUNTER — Emergency Department
Admission: EM | Admit: 2024-02-28 | Discharge: 2024-02-28 | Discharge status: Against Medical Advice | Payer: No Typology Code available for payment source | Attending: Emergency Medicine | Admitting: Emergency Medicine

## 2024-02-28 DIAGNOSIS — I10 Essential (primary) hypertension: Secondary | ICD-10-CM | POA: Diagnosis not present

## 2024-02-28 DIAGNOSIS — R208 Other disturbances of skin sensation: Secondary | ICD-10-CM | POA: Insufficient documentation

## 2024-02-28 DIAGNOSIS — Z1389 Encounter for screening for other disorder: Secondary | ICD-10-CM | POA: Diagnosis not present

## 2024-02-28 DIAGNOSIS — M549 Dorsalgia, unspecified: Secondary | ICD-10-CM | POA: Insufficient documentation

## 2024-02-28 DIAGNOSIS — M4802 Spinal stenosis, cervical region: Secondary | ICD-10-CM | POA: Diagnosis not present

## 2024-02-28 DIAGNOSIS — R079 Chest pain, unspecified: Secondary | ICD-10-CM | POA: Diagnosis not present

## 2024-02-28 DIAGNOSIS — Z0131 Encounter for examination of blood pressure with abnormal findings: Secondary | ICD-10-CM | POA: Diagnosis not present

## 2024-02-28 DIAGNOSIS — Z5321 Procedure and treatment not carried out due to patient leaving prior to being seen by health care provider: Secondary | ICD-10-CM | POA: Diagnosis not present

## 2024-02-28 DIAGNOSIS — E871 Hypo-osmolality and hyponatremia: Secondary | ICD-10-CM | POA: Diagnosis not present

## 2024-02-28 DIAGNOSIS — M5416 Radiculopathy, lumbar region: Secondary | ICD-10-CM | POA: Diagnosis not present

## 2024-02-28 DIAGNOSIS — M2540 Effusion, unspecified joint: Secondary | ICD-10-CM | POA: Diagnosis not present

## 2024-02-28 DIAGNOSIS — R0789 Other chest pain: Secondary | ICD-10-CM | POA: Diagnosis not present

## 2024-02-28 LAB — BASIC METABOLIC PANEL
Anion gap: 10 (ref 5–15)
BUN: 11 mg/dL (ref 8–23)
CO2: 23 mmol/L (ref 22–32)
Calcium: 8.8 mg/dL — ABNORMAL LOW (ref 8.9–10.3)
Chloride: 99 mmol/L (ref 98–111)
Creatinine, Ser: 0.74 mg/dL (ref 0.61–1.24)
GFR, Estimated: >60 mL/min (ref >60)
Glucose, Bld: 106 mg/dL — ABNORMAL HIGH (ref 70–99)
Potassium: 4 mmol/L (ref 3.5–5.1)
Sodium: 132 mmol/L — ABNORMAL LOW (ref 135–145)

## 2024-02-28 LAB — CBC
HCT: 36.5 % — ABNORMAL LOW (ref 39.0–52.0)
Hemoglobin: 12.3 g/dL — ABNORMAL LOW (ref 13.0–17.0)
MCH: 30.1 pg (ref 26.0–34.0)
MCHC: 33.7 g/dL (ref 30.0–36.0)
MCV: 89.2 fL (ref 80.0–100.0)
Platelets: 380 10*3/uL (ref 150–400)
RBC: 4.09 MIL/uL — ABNORMAL LOW (ref 4.22–5.81)
RDW: 13.2 % (ref 11.5–15.5)
WBC: 11.7 10*3/uL — ABNORMAL HIGH (ref 4.0–10.5)
nRBC: 0 % (ref 0.0–0.2)

## 2024-02-28 LAB — TROPONIN I (HIGH SENSITIVITY): Troponin I (High Sensitivity): 33 ng/L — ABNORMAL HIGH (ref <18)

## 2024-02-28 NOTE — ED Triage Notes (Signed)
 Patient presents pov for back pain and chest pain,  seen here on 02/26/24 for the same, stating he can not take his norco pain medication because it hurts his stomach.  Chest cavity numbness.

## 2024-06-05 ENCOUNTER — Encounter: Payer: Self-pay | Admitting: Cardiovascular Disease

## 2024-09-09 ENCOUNTER — Other Ambulatory Visit: Payer: Self-pay | Admitting: Urology

## 2024-09-09 DIAGNOSIS — N529 Male erectile dysfunction, unspecified: Secondary | ICD-10-CM

## 2024-09-15 ENCOUNTER — Other Ambulatory Visit: Payer: Self-pay

## 2024-09-15 DIAGNOSIS — N138 Other obstructive and reflux uropathy: Secondary | ICD-10-CM

## 2024-09-15 MED ORDER — TAMSULOSIN HCL 0.4 MG PO CAPS
0.4000 mg | ORAL_CAPSULE | Freq: Every day | ORAL | 3 refills | Status: DC
Start: 2024-09-15 — End: 2024-09-23

## 2024-09-21 NOTE — Progress Notes (Deleted)
 09/22/2024 11:39 AM   Kenneth Daniels 01-Mar-1956 [id number]  Referring provider: Ernie Yancy Roof, MD 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 Prostate nodule -prostate biopsy 08/2018 benign  3. BPH with LU TS -Tamsulosin  0.4 mg daily  4. Prostate cancer screening -PSA (02/2024) 1.29 -cousin died of prostate cancer, age 83 -African ancestry -prostate biopsy (2019) benign  5. ED -contributing factors of age, history of smoking, MI, stroke, CAD, PAD, BPH, HTN, HLD, depression and anxiety -sildenafil  100 mg, on-demand-dosing  No chief complaint on file.   HPI: Kenneth Daniels is a 68 y.o. male who presents today for follow up.  Previous Records reviewed.  I PSS ***  He reports sensation of incomplete bladder emptying, urinary frequency, urinary intermittency, urinary urgency, a weak urinary stream, having to strain to void, nocturia x ***, leaking before being able to reach the restroom, leaking with coughing, leaking without awareness, and post void dribbling.     He is wearing *** pads//depends  daily.    Patient denies any modifying or aggravating factors.  Patient denies any recent UTI's, gross hematuria, dysuria or suprapubic/flank pain.  Patient denies any fevers, chills, nausea or vomiting.  ***  He has a family history of PCa, colon cancer, ovarian cancer and/or breast cancer with ***.   He does not have a family history of PCa, colon cancer, ovarian cancer, and/or breast cancer .***     UA (02/2024) Negative   PVR***  PSA  (02/2024) 1.29   Serum creatinine (07/2024) 0.89, eGFR >90   Fluid consumptiom: ***  SHIM ***  He does not have confidence that he could get and keep an erection, his erections are not firm enough for penetrative  intercourse, he has difficulty maintaining his erections,  and he is not finding intercourse satisfactory for him.  ***  Patient still having spontaneous erections.  ***   He denies any pain or curvature with erections.    He is not able to ejaculate, has pain with ejaculation, and has blood in his ejaculate fluid.    Testosterone  level ***  Cholesterol (02/2024) 143  TSH (02/2024) 1.998   Tried and failed ***    PMH: Past Medical History:  Diagnosis Date   Absolute anemia 03/27/2012   Anxiety    Atypical chest pain 04/17/2016   CAD in native artery 04/17/2016   Cerebral infarction (HCC) 11/25/2013   Overview:  IMO Problem List Replacer Jan. 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   BACK SURGERY     CERVICAL DISC ARTHROPLASTY N/A 09/11/2017   Procedure: CERVICAL ANTERIOR DISC ARTHROPLASTY C3-4 POSSIBLE C4-5;  Surgeon: Clois Fret, MD;  Location: [facility] ORS;  Service: Neurosurgery;  Laterality: N/A;  COLON SURGERY     Colon Resection   COLONOSCOPY WITH PROPOFOL  N/A 12/11/2022   Procedure: COLONOSCOPY WITH PROPOFOL ;  Surgeon: Maryruth Ole DASEN, MD;  Location: [facility] ENDOSCOPY;  Service: Endoscopy;  Laterality: N/A;   HERNIA REPAIR     HOLMIUM LASER APPLICATION N/A 11/20/2016   Procedure: HOLMIUM LASER APPLICATION;  Surgeon: Rosina Riis, MD;  Location: [facility] ORS;  Service: Urology;  Laterality: N/A;    PROSTATE ABLATION N/A 11/20/2016   Procedure: PROSTATE ABLATION;  Surgeon: Rosina Riis, MD;  Location: [facility] ORS;  Service: Urology;  Laterality: N/A;   SOFT TISSUE TUMOR RESECTION     left thigh   TRANSURETHRAL RESECTION OF PROSTATE N/A 11/20/2016   Procedure: TRANSURETHRAL RESECTION OF THE PROSTATE (TURP);  Surgeon: Rosina Riis, MD;  Location: [facility] ORS;  Service: Urology;  Laterality: N/A;    Home Medications:  Allergies as of 09/22/2024       Reactions   Oxycodone  Other (See Comments)   Other reaction(s): Confusion Had me all messed up Had me all messed up   Gabapentin Other (See Comments)   Hydrocodone -acetaminophen     Lisinopril     Sulfa Antibiotics Rash        Medication List        Accurate as of September 21, 2024 11:39 AM. If you have any questions, ask your nurse or doctor.          aspirin  EC 81 MG tablet Take 1 tablet (81 mg total) by mouth daily.   celecoxib 100 MG capsule Commonly known as: CELEBREX Take 100 mg by mouth 2 (two) times daily.   HYDROcodone -acetaminophen  5-325 MG tablet Commonly known as: NORCO/VICODIN Take 1 tablet by mouth every 4 (four) hours as needed.   meloxicam  15 MG tablet Commonly known as: MOBIC  Take by mouth.   phenytoin  100 MG ER capsule Commonly known as: DILANTIN  TAKE 3 CAPSULES IN THE MORNING AND 2 CAPSULES EVERY EVENING   phenytoin  50 MG tablet Commonly known as: DILANTIN  Chew 50 mg by mouth 2 (two) times daily.   pregabalin 75 MG capsule Commonly known as: LYRICA TAKE 1 CAPSULE THREE TIMES A DAY AS NEEDED   sildenafil  100 MG tablet Commonly known as: VIAGRA  TAKE 1 TABLET BY MOUTH ONCE DAILY AS NEEDED FOR ERECTILE DYSFUNCTION   simvastatin 20 MG tablet Commonly known as: ZOCOR TAKE 1 TABLET EVERY EVENING FOR HIGH CHOLESTEROL   tamsulosin  0.4 MG Caps capsule Commonly known as: FLOMAX  Take 1 capsule (0.4 mg total) by mouth daily.   Vitamin D-3 125 MCG (5000 UT) Tabs Take 5,000 Units by mouth  daily.        Allergies:  Allergies  Allergen Reactions   Oxycodone  Other (See Comments)    Other reaction(s): Confusion Had me all messed up Had me all messed up   Gabapentin Other (See Comments)   Hydrocodone -Acetaminophen     Lisinopril     Sulfa Antibiotics Rash    Family History: Family History  Problem Relation Age of Onset   Bladder Cancer Neg Hx    Prostate cancer Neg Hx    Kidney cancer Neg Hx     Social History:  reports that he quit smoking about 16 years ago. His smoking use included cigarettes. He started smoking about 54 years ago. He has a 28.5 pack-year smoking history. He has never used smokeless tobacco. He reports current drug use. Drug: Marijuana. He reports that he does not drink alcohol.  ROS: For pertinent review of systems please refer to history of  present illness  Physical Exam: There were no vitals taken for this visit.  Constitutional:  Well nourished. Alert and oriented, No acute distress. HEENT: [place] AT, moist mucus membranes.  Trachea midline, no masses. Cardiovascular: No clubbing, cyanosis, or edema. Respiratory: Normal respiratory effort, no increased work of breathing. GI: Abdomen is soft, non tender, non distended, no abdominal masses. Liver and spleen not palpable.  No hernias appreciated.  Stool sample for occult testing is not indicated.   GU: No CVA tenderness.  No bladder fullness or masses.  Patient with circumcised/uncircumcised phallus. ***Foreskin easily retracted***  Urethral meatus is patent.  No penile discharge. No penile lesions or rashes. Scrotum without lesions, cysts, rashes and/or edema.  Testicles are located scrotally bilaterally. No masses are appreciated in the testicles. Left and right epididymis are normal. Rectal: Patient with  normal sphincter tone. Anus and perineum without scarring or rashes. No rectal masses are appreciated. Prostate is approximately *** grams, *** nodules are appreciated. Seminal vesicles are  normal. Skin: No rashes, bruises or suspicious lesions. Lymph: No cervical or inguinal adenopathy. Neurologic: Grossly intact, no focal deficits, moving all 4 extremities. Psychiatric: Normal mood and affect.   Laboratory Data: See EPIC and HPI I have reviewed the labs  Pertinent imaging: N/A  Assessment & Plan:    1.  BPH with LUTS -At goal -Continue tamsulosin  0.4 mg daily  2. Prostate cancer screening  - PSA stable  -prostate biopsy negative 2019 -African ancestry -cousin died of prostate cancer, age 45  3. ED -Not sexually active as he has been in the past -testosterone  level pending  -Continue sildenafil  100 mg on demand dosing  No follow-ups on file.  Kenneth Daniels  [facility] [address], [address] [place], [place] [address] [phone]

## 2024-09-22 ENCOUNTER — Ambulatory Visit: Payer: Self-pay | Admitting: Urology

## 2024-09-22 DIAGNOSIS — Z125 Encounter for screening for malignant neoplasm of prostate: Secondary | ICD-10-CM

## 2024-09-22 DIAGNOSIS — N529 Male erectile dysfunction, unspecified: Secondary | ICD-10-CM

## 2024-09-22 DIAGNOSIS — N138 Other obstructive and reflux uropathy: Secondary | ICD-10-CM

## 2024-09-23 ENCOUNTER — Ambulatory Visit (INDEPENDENT_AMBULATORY_CARE_PROVIDER_SITE_OTHER): Admitting: Urology

## 2024-09-23 ENCOUNTER — Encounter: Payer: Self-pay | Admitting: Urology

## 2024-09-23 VITALS — BP 104/64 | HR 88 | Ht 70.0 in | Wt 129.9 lb

## 2024-09-23 DIAGNOSIS — N138 Other obstructive and reflux uropathy: Secondary | ICD-10-CM

## 2024-09-23 DIAGNOSIS — N401 Enlarged prostate with lower urinary tract symptoms: Secondary | ICD-10-CM

## 2024-09-23 DIAGNOSIS — N529 Male erectile dysfunction, unspecified: Secondary | ICD-10-CM

## 2024-09-23 MED ORDER — SILDENAFIL CITRATE 100 MG PO TABS: 100.0000 mg | ORAL_TABLET | Freq: Every day | ORAL | 0 refills | Status: AC | PRN

## 2024-09-23 MED ORDER — SILDENAFIL CITRATE 100 MG PO TABS: 100.0000 mg | ORAL_TABLET | Freq: Every day | ORAL | 0 refills | Status: DC | PRN

## 2024-09-23 MED ORDER — TAMSULOSIN HCL 0.4 MG PO CAPS: 0.4000 mg | ORAL_CAPSULE | Freq: Every day | ORAL | 3 refills | Status: AC

## 2024-09-23 MED ORDER — TAMSULOSIN HCL 0.4 MG PO CAPS: 0.4000 mg | ORAL_CAPSULE | Freq: Every day | ORAL | 3 refills | Status: DC

## 2024-09-23 NOTE — Progress Notes (Signed)
 09/23/2024 10:42 AM   Kenneth Daniels 02/09/1956 [id number]  Referring provider: Ernie Yancy Roof, MD 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 Prostate nodule -prostate biopsy 08/2018 benign  3. BPH with LU TS -Tamsulosin  0.4 mg daily  4. Prostate cancer screening -PSA (02/2024) 1.29 -cousin died of prostate cancer, age 45 -African ancestry -prostate biopsy (2019) benign  5. ED -contributing factors of age, history of smoking, MI, stroke, CAD, PAD, BPH, HTN, HLD, depression and anxiety -sildenafil  100 mg, on-demand-dosing  Chief Complaint  Patient presents with   BPH with obstruction/lower urinary tract symptoms    HPI: Kenneth Daniels is a 68 y.o. male who presents today for follow up.  Previous Records reviewed.  I PSS 11/3  He is only complaint today is postvoid dribbling.  Patient denies any modifying or aggravating factors.  Patient denies any recent UTI's, gross hematuria, dysuria or suprapubic/flank pain.  Patient denies any fevers, chills, nausea or vomiting.    UA (02/2024) Negative   PSA  (02/2024) 1.29   Serum creatinine (07/2024) 0.89, eGFR >90    SHIM 11  He states erections are fine.  He is taking the sildenafil  100 mg on demand dosing.  Patient still having spontaneous erections.  He denies any pain or curvature with erections.    Testosterone  level pending   Cholesterol (02/2024) 143  TSH (02/2024) 1.998    PMH: Past Medical History:  Diagnosis Date   Absolute anemia 03/27/2012   Anxiety    Atypical chest pain 04/17/2016   CAD in native artery 04/17/2016   Cerebral infarction (HCC) 11/25/2013   Overview:  IMO Problem List Replacer Jan. 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   BACK SURGERY     CERVICAL DISC ARTHROPLASTY N/A 09/11/2017   Procedure: CERVICAL ANTERIOR DISC ARTHROPLASTY C3-4 POSSIBLE C4-5;  Surgeon: Clois Fret, MD;  Location: [facility] ORS;  Service: Neurosurgery;  Laterality: N/A;   COLON SURGERY     Colon Resection   COLONOSCOPY WITH PROPOFOL  N/A 12/11/2022   Procedure: COLONOSCOPY WITH PROPOFOL ;  Surgeon: Maryruth Ole DASEN, MD;  Location: [facility] ENDOSCOPY;  Service: Endoscopy;  Laterality: N/A;   HERNIA REPAIR     HOLMIUM LASER APPLICATION N/A 11/20/2016   Procedure: HOLMIUM LASER APPLICATION;  Surgeon: Rosina Riis, MD;  Location: [facility] ORS;  Service: Urology;  Laterality: N/A;   PROSTATE ABLATION N/A 11/20/2016   Procedure: PROSTATE ABLATION;  Surgeon: Rosina Riis, MD;  Location: [facility] ORS;  Service: Urology;  Laterality: N/A;   SOFT TISSUE TUMOR RESECTION     left thigh   TRANSURETHRAL RESECTION OF PROSTATE N/A 11/20/2016   Procedure: TRANSURETHRAL RESECTION OF THE PROSTATE (TURP);  Surgeon: Rosina Riis, MD;  Location: [facility] ORS;  Service: Urology;  Laterality: N/A;    Home Medications:  Allergies as of 09/23/2024       Reactions   Oxycodone  Other (See Comments)   Other reaction(s): Confusion Had me all messed up Had me all messed up   Gabapentin Other (See Comments)   Hydrocodone -acetaminophen     Lisinopril     Sulfa Antibiotics Rash        Medication List        Accurate as of September 23, 2024 10:42 AM. If you have any questions, ask your nurse or doctor.          STOP taking these medications    meloxicam  15 MG tablet Commonly known as: MOBIC  Stopped by: Josslyn Ciolek   phenytoin  100 MG ER capsule Commonly known as: DILANTIN  Stopped by: Tyus Kallam   pregabalin 75 MG capsule Commonly known as: LYRICA Stopped by: Jahir Halt       TAKE these medications    amLODipine 2.5 MG tablet Commonly known as: NORVASC Take 2.5 mg by mouth daily.   aspirin  81 MG chewable tablet Chew 81 mg by mouth. What changed: Another medication with the same name was removed. Continue taking this medication, and follow the directions you see here. Changed by: CLOTILDA CORNWALL   Biktarvy 50-200-25 MG Tabs tablet Generic drug: bictegravir-emtricitabine-tenofovir AF Take 1 tablet by mouth daily.   celecoxib 100 MG capsule Commonly known as: CELEBREX Take 100 mg by mouth 2 (two) times daily.   HYDROcodone -acetaminophen  5-325 MG tablet Commonly known as: NORCO/VICODIN Take 1 tablet by mouth every 4 (four) hours as needed.   levETIRAcetam 750 MG tablet Commonly known as: KEPPRA Take 750 mg by mouth 2 (two) times daily.   phenytoin  50 MG tablet Commonly known as: DILANTIN  Chew 50 mg by mouth 2 (two) times daily.   sildenafil  100 MG tablet Commonly known as: VIAGRA  Take 1 tablet (100 mg total) by mouth daily as needed for erectile dysfunction.   simvastatin 20 MG tablet Commonly known as: ZOCOR TAKE 1 TABLET EVERY EVENING FOR HIGH CHOLESTEROL   tamsulosin  0.4 MG Caps capsule Commonly known as: FLOMAX  Take 1 capsule (0.4 mg total) by mouth daily.   Vitamin D-3 125 MCG (5000 UT) Tabs Take 5,000 Units by mouth daily.        Allergies:  Allergies  Allergen Reactions   Oxycodone  Other (See Comments)    Other reaction(s): Confusion Had me all messed up Had me all messed up   Gabapentin Other (See Comments)   Hydrocodone -Acetaminophen      Lisinopril     Sulfa Antibiotics Rash    Family History: Family History  Problem Relation Age of Onset   Bladder Cancer Neg Hx    Prostate cancer Neg Hx    Kidney cancer Neg Hx     Social History:  reports that he quit smoking about 16 years ago. His smoking use included cigarettes. He started smoking about 54 years ago. He has a 28.5 pack-year smoking history. He has never used smokeless tobacco. He reports current drug use. Drug: Marijuana. He reports that he does not drink alcohol.  ROS: For pertinent review of systems please refer to history of present illness  Physical Exam: BP 104/64 (BP Location: Left Arm, Patient Position: Sitting, Cuff Size: Normal)   Pulse 88   Ht 5' 10 (1.778 m)   Wt 129 lb 14.4 oz (58.9 kg)   BMI 18.64 kg/m   Constitutional:  Well nourished. Alert and oriented,  No acute distress. HEENT: [place] AT, moist mucus membranes.  Trachea midline Cardiovascular: No clubbing, cyanosis, or edema. Respiratory: Normal respiratory effort, no increased work of breathing.  GU and Rectal: Patient deferred exam, stating he was only here for his postvoid dribbling and was emphatic about this Neurologic: Grossly intact, no focal deficits, moving all 4 extremities. Psychiatric: Normal mood and affect.   Laboratory Data: See EPIC and HPI I have reviewed the labs  Pertinent imaging: N/A  Assessment & Plan:    1.  BPH with LUTS -Bothered by postvoid dribbling, instructed him to start Kegel exercises and gave him the handout on how to perform these exercises -Continue tamsulosin  0.4 mg daily  2. Prostate cancer screening  - PSA stable  -prostate biopsy negative 2019 -African ancestry -cousin died of prostate cancer, age 47  3. ED -testosterone  level pending  -Continue sildenafil  100 mg on demand dosing  Return in about 1 year (around 09/23/2025) for PSA/DRE .  CLOTILDA HELON RIGGERS  [facility] [address] [place], [place] [address] [phone]

## 2024-09-24 ENCOUNTER — Ambulatory Visit: Payer: Self-pay | Admitting: Urology

## 2024-09-24 LAB — TESTOSTERONE: Testosterone: 856 ng/dL (ref 264–916)

## 2025-09-23 ENCOUNTER — Ambulatory Visit: Admitting: Urology
